# Patient Record
Sex: Female | Born: 2000 | Race: White | Hispanic: No | Marital: Single | Smoking: Never smoker
Health system: Southern US, Community
[De-identification: ages and names within clinical notes are randomized; demographics above are authoritative.]

## PROBLEM LIST (undated history)

## (undated) DIAGNOSIS — J45909 Unspecified asthma, uncomplicated: Secondary | ICD-10-CM

## (undated) DIAGNOSIS — J309 Allergic rhinitis, unspecified: Secondary | ICD-10-CM

## (undated) HISTORY — DX: Unspecified asthma, uncomplicated: J45.909

## (undated) HISTORY — DX: Allergic rhinitis, unspecified: J30.9

---
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
## 2018-02-12 ENCOUNTER — Ambulatory Visit: Payer: Medicaid Other | Admitting: Family Medicine

## 2018-02-14 ENCOUNTER — Ambulatory Visit (INDEPENDENT_AMBULATORY_CARE_PROVIDER_SITE_OTHER): Payer: Self-pay | Admitting: Family Medicine

## 2018-02-14 ENCOUNTER — Encounter: Payer: Self-pay | Admitting: Family Medicine

## 2018-02-14 ENCOUNTER — Ambulatory Visit
Admission: RE | Admit: 2018-02-14 | Discharge: 2018-02-14 | Disposition: A | Discharge status: Home / Self Care | Payer: Self-pay | Source: Ambulatory Visit | Attending: Family Medicine | Admitting: Family Medicine

## 2018-02-14 VITALS — BP 108/64 | HR 76 | Temp 98.5°F | Resp 16 | Wt 139.0 lb

## 2018-02-14 DIAGNOSIS — M419 Scoliosis, unspecified: Secondary | ICD-10-CM

## 2018-02-14 DIAGNOSIS — F419 Anxiety disorder, unspecified: Secondary | ICD-10-CM

## 2018-02-14 DIAGNOSIS — M4185 Other forms of scoliosis, thoracolumbar region: Secondary | ICD-10-CM | POA: Insufficient documentation

## 2018-02-14 MED ORDER — FLUOXETINE HCL 20 MG PO TABS: 30.0000 mg | ORAL_TABLET | Freq: Every day | ORAL | 3 refills | Status: DC

## 2018-02-14 NOTE — Assessment & Plan Note (Signed)
Likely MSK back pain from scoliosis Back exercises given

## 2018-02-14 NOTE — Patient Instructions (Signed)

## 2018-02-14 NOTE — Assessment & Plan Note (Signed)
Fairly well controlled and much improved Will increase Prozac to 30g daily to see if more improvement Continue propranolol prn performance anxiety, but patient is not currently needing this F/u in 6 months at physical

## 2018-02-14 NOTE — Progress Notes (Signed)
Patient: Kelli James Female    DOB: 02-04-2001   17 y.o.   MRN: [id number] Visit Date: 02/14/2018  Today's Provider: Shirlee LatchAngela Judithe Keetch, MD   I, Joslyn HyEmily Ratchford, CMA, am acting as scribe for Shirlee LatchAngela Sylvester Salonga, MD.  Chief Complaint  Patient presents with  . Anxiety   Subjective:    Anxiety  Presents for follow-up (LOV 11/14/2017. Pt was advised to continue Prozac and Propranolol for performance anxiety. Plan to increase dose if PHQ-9 or GAD-7 scores are high today.) visit. Patient reports no chest pain, compulsions, confusion, decreased concentration, depressed mood, dizziness, dry mouth, excessive worry, feeling of choking, hyperventilation, impotence, insomnia, irritability, malaise, muscle tension, nausea, nervous/anxious behavior, obsessions, palpitations, panic, restlessness, shortness of breath or suicidal ideas. The severity of symptoms is mild. The patient sleeps 8 hours per night. The quality of sleep is good.   Compliance with medications: good compliance. Treatment side effects: no side effects.  Feels like she is greatly improved but wonders if slightly higher medication dose would improve more.  H/o scoliosis with back pain worsening recently.  Worse with Standing for long periods of time. Better with sleeping. Described as Intermittent soreness in Thoracic spine.    No Known Allergies   Current Outpatient Medications:  .  Albuterol Sulfate (PROAIR RESPICLICK) 108 (90 BASE) MCG/ACT AEPB, Inhale 2 puffs into the lungs every 6 (six) hours as needed., Disp: , Rfl:  .  cetirizine (ZYRTEC) 10 MG tablet, Take 10 mg by mouth daily., Disp: , Rfl:  .  FLUoxetine (PROZAC) 20 MG tablet, Take 1 tablet (20 mg total) by mouth daily., Disp: 90 tablet, Rfl: 3 .  Norgestim-Eth Estrad Triphasic (ORTHO TRI-CYCLEN, 28, PO), Take 1 tablet by mouth daily., Disp: , Rfl:  .  propranolol (INDERAL) 40 MG tablet, Take 1 tablet (40 mg total) 3 (three) times daily as needed by mouth  (anxiety). Take 60 minutes before anxiety-producing event, Disp: 30 tablet, Rfl: 3  Review of Systems  Constitutional: Negative for irritability.  Respiratory: Negative for shortness of breath.   Cardiovascular: Negative for chest pain and palpitations.  Gastrointestinal: Negative for nausea.  Genitourinary: Negative for impotence.  Neurological: Negative for dizziness.  Psychiatric/Behavioral: Negative for confusion, decreased concentration and suicidal ideas. The patient is not nervous/anxious and does not have insomnia.     Social History   Tobacco Use  . Smoking status: Never Smoker  . Smokeless tobacco: Never Used  Substance Use Topics  . Alcohol use: No    Frequency: Never   Objective:   BP 108/64 (BP Location: Left Arm, Patient Position: Sitting, Cuff Size: Normal)   Pulse 76   Temp 98.5 F (36.9 C) (Oral)   Resp 16   Wt 139 lb (63 kg)   LMP 01/24/2018   SpO2 99%   BMI 22.44 kg/m  Vitals:   02/14/18 1048  BP: 108/64  Pulse: 76  Resp: 16  Temp: 98.5 F (36.9 C)  TempSrc: Oral  SpO2: 99%  Weight: 139 lb (63 kg)     Physical Exam  Constitutional: She is oriented to person, place, and time. She appears well-developed and well-nourished. No distress.  HENT:  Head: Normocephalic and atraumatic.  Eyes: Conjunctivae are normal. No scleral icterus.  Cardiovascular: Normal rate, regular rhythm, normal heart sounds and intact distal pulses.  No murmur heard. Pulmonary/Chest: Effort normal and breath sounds normal. No respiratory distress. She has no wheezes. She has no rales.  Musculoskeletal: She exhibits no edema.  Back: Mild TTP over thoracic spine musculature. No midline TTP. Normal ROM. Slight curvature noted. R shoulder higher than L when bending over.  Neurological: She is alert and oriented to person, place, and time.  Skin: Skin is warm and dry. No rash noted.  Psychiatric: She has a normal mood and affect. Her behavior is normal.  Vitals  reviewed.       Assessment & Plan:      Problem List Items Addressed This Visit      Musculoskeletal and Integument   Scoliosis    Likely MSK back pain from scoliosis Back exercises given       Relevant Orders   DG SCOLIOSIS EVAL COMPLETE SPINE 1 VIEW (Completed)     Other   Anxiety - Primary    Fairly well controlled and much improved Will increase Prozac to 30g daily to see if more improvement Continue propranolol prn performance anxiety, but patient is not currently needing this F/u in 6 months at physical      Relevant Medications   FLUoxetine (PROZAC) 20 MG tablet       Return in about 6 months (around 08/17/2018) for physical.   The entirety of the information documented in the History of Present Illness, Review of Systems and Physical Exam were personally obtained by me. Portions of this information were initially documented by Irving Burton Ratchford, CMA and reviewed by me for thoroughness and accuracy.    Erasmo Downer, MD, MPH [facility] 02/17/2018 8:11 AM

## 2018-02-17 ENCOUNTER — Telehealth: Payer: Self-pay

## 2018-02-17 NOTE — Telephone Encounter (Signed)
Pt advised.   Thanks,   -Lattie Riege  

## 2018-02-17 NOTE — Telephone Encounter (Signed)
-----   Message from Erasmo DownerAngela M Bacigalupo, MD sent at 02/17/2018  8:38 AM EDT ----- Mild scoliosis curvature of mid and lower back.  Strengthening and stretching as discussed  Bacigalupo, Marzella SchleinAngela M, MD, MPH [facility] 02/17/2018 8:38 AM

## 2018-03-11 ENCOUNTER — Encounter: Payer: Self-pay | Admitting: Family Medicine

## 2018-03-11 ENCOUNTER — Ambulatory Visit: Payer: BLUE CROSS/BLUE SHIELD | Admitting: Family Medicine

## 2018-03-11 VITALS — BP 108/72 | HR 79 | Temp 98.4°F | Resp 16 | Wt 140.0 lb

## 2018-03-11 DIAGNOSIS — J0140 Acute pansinusitis, unspecified: Secondary | ICD-10-CM | POA: Diagnosis not present

## 2018-03-11 MED ORDER — FLUTICASONE PROPIONATE 50 MCG/ACT NA SUSP: 2.0000 | Freq: Every day | NASAL | 6 refills | Status: DC

## 2018-03-11 MED ORDER — AMOXICILLIN-POT CLAVULANATE 875-125 MG PO TABS: 1.0000 | ORAL_TABLET | Freq: Two times a day (BID) | ORAL | 0 refills | Status: AC

## 2018-03-11 NOTE — Progress Notes (Signed)
Patient: Kelli James Female    DOB: 13-Apr-2001   17 y.o.   MRN: [id number] Visit Date: 03/11/2018  Today's Provider: Shirlee Latch, MD   I, Joslyn Hy, CMA, am acting as scribe for Shirlee Latch, MD.  Chief Complaint  Patient presents with  . Sinus Problem   Subjective:    Sinus Problem  This is a new problem. Episode onset: x 5 days. The problem has been gradually worsening since onset. Maximum temperature: highest documented temperature was 99. Pain severity now: moderate to severe. Associated symptoms include chills (Saturday), congestion, coughing (some), diaphoresis, ear pain, headaches (pulsating), a hoarse voice, sinus pressure, sneezing and a sore throat. Pertinent negatives include no neck pain, shortness of breath or swollen glands. (Teeth pain is present, right eye pain and discharge) Past treatments include acetaminophen (BC Powder, DayQuil, Netti Pot, and Zyrtec). The treatment provided no relief.  Pt went to [facility], which is riding grounds for ATVs, dirt bikes & other recreational vehicles over the weekend. She believes the pollen and dirt is causing her current symptoms.  She also has known sick contacts.  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  Their mother is also sick with a sinus infection and an ear infection.  No Known Allergies   Current Outpatient Medications:  .  cetirizine (ZYRTEC) 10 MG tablet, Take 10 mg by mouth daily., Disp: , Rfl:  .  FLUoxetine (PROZAC) 20 MG tablet, Take 1.5 tablets (30 mg total) by mouth daily., Disp: 135 tablet, Rfl: 3 .  Norgestim-Eth Estrad Triphasic (ORTHO TRI-CYCLEN, 28, PO), Take 1 tablet by mouth daily., Disp: , Rfl:  .  propranolol (INDERAL) 40 MG tablet, Take 1 tablet (40 mg total) 3 (three) times daily as needed by mouth (anxiety). Take 60 minutes before anxiety-producing event, Disp: 30 tablet, Rfl: 3 .  Albuterol Sulfate (PROAIR  RESPICLICK) 108 (90 BASE) MCG/ACT AEPB, Inhale 2 puffs into the lungs every 6 (six) hours as needed., Disp: , Rfl:   Review of Systems  Constitutional: Positive for chills (Saturday) and diaphoresis.  HENT: Positive for congestion, ear pain, hoarse voice, sinus pressure, sneezing and sore throat.   Respiratory: Positive for cough (some). Negative for shortness of breath.   Musculoskeletal: Negative for neck pain.  Neurological: Positive for headaches (pulsating).    Social History   Tobacco Use  . Smoking status: Never Smoker  . Smokeless tobacco: Never Used  Substance Use Topics  . Alcohol use: No    Frequency: Never   Objective:   BP 108/72 (BP Location: Left Arm, Patient Position: Sitting, Cuff Size: Normal)   Pulse 79   Temp 98.4 F (36.9 C) (Oral)   Resp 16   Wt 140 lb (63.5 kg)   LMP 02/11/2018   SpO2 98%   BMI 22.60 kg/m  Vitals:   03/11/18 0948  BP: 108/72  Pulse: 79  Resp: 16  Temp: 98.4 F (36.9 C)  TempSrc: Oral  SpO2: 98%  Weight: 140 lb (63.5 kg)     Physical Exam  Constitutional: She is oriented to person, place, and time. She appears well-developed and well-nourished. No distress.  HENT:  Head: Normocephalic and atraumatic.  Right Ear: Tympanic membrane, external ear and ear canal normal.  Left Ear: Tympanic membrane, external ear and ear canal normal.  Nose: Mucosal edema present. Right sinus exhibits maxillary sinus tenderness and frontal sinus tenderness. Left sinus exhibits maxillary  sinus tenderness and frontal sinus tenderness.  Mouth/Throat: Uvula is midline, oropharynx is clear and moist and mucous membranes are normal.  Eyes: Pupils are equal, round, and reactive to light. Conjunctivae are normal. Right eye exhibits no discharge. Left eye exhibits no discharge. No scleral icterus.  Neck: Neck supple. No thyromegaly present.  Cardiovascular: Normal rate, regular rhythm and normal heart sounds.  No murmur heard. Pulmonary/Chest: Effort  normal and breath sounds normal. No respiratory distress. She has no wheezes. She has no rales.  Musculoskeletal: She exhibits no edema.  Lymphadenopathy:    She has no cervical adenopathy.  Neurological: She is alert and oriented to person, place, and time.  Skin: Skin is warm and dry. No rash noted.  Psychiatric: She has a normal mood and affect. Her behavior is normal.  Vitals reviewed.     Assessment & Plan:   1. Acute non-recurrent pansinusitis -Symptoms and exam consistent with sinusitis -Likely caused by some combination of viral URI and dust and debris exposure -As she is only 5 days into illness, recommend not treating with antibiotic for at least 2-5 more days -Discussed natural course, symptomatic management with Sudafed or decongestant, Flonase, nasal rinses, antihistamine - Given prescription for Augmentin for 7 days if this does persist past 7-10 days    Meds ordered this encounter  Medications  . fluticasone (FLONASE) 50 MCG/ACT nasal spray    Sig: Place 2 sprays into both nostrils daily.    Dispense:  16 g    Refill:  6  . amoxicillin-clavulanate (AUGMENTIN) 875-125 MG tablet    Sig: Take 1 tablet by mouth 2 (two) times daily for 7 days.    Dispense:  14 tablet    Refill:  0     Return if symptoms worsen or fail to improve.   The entirety of the information documented in the History of Present Illness, Review of Systems and Physical Exam were personally obtained by me. Portions of this information were initially documented by Irving BurtonEmily Ratchford, CMA and reviewed by me for thoroughness and accuracy.    Erasmo DownerBacigalupo, Angela M, MD, MPH [facility] 03/11/2018 10:19 AM

## 2018-03-11 NOTE — Patient Instructions (Signed)

## 2018-03-19 DIAGNOSIS — N9089 Other specified noninflammatory disorders of vulva and perineum: Secondary | ICD-10-CM | POA: Diagnosis not present

## 2018-03-19 DIAGNOSIS — L292 Pruritus vulvae: Secondary | ICD-10-CM | POA: Diagnosis not present

## 2018-03-26 ENCOUNTER — Ambulatory Visit: Payer: BLUE CROSS/BLUE SHIELD | Admitting: Family Medicine

## 2018-03-26 ENCOUNTER — Encounter: Payer: Self-pay | Admitting: Family Medicine

## 2018-03-26 VITALS — BP 108/66 | HR 67 | Temp 97.9°F | Resp 16 | Wt 138.0 lb

## 2018-03-26 DIAGNOSIS — L249 Irritant contact dermatitis, unspecified cause: Secondary | ICD-10-CM

## 2018-03-26 MED ORDER — TRIAMCINOLONE ACETONIDE 0.5 % EX OINT: 1.0000 "application " | TOPICAL_OINTMENT | Freq: Two times a day (BID) | CUTANEOUS | 1 refills | Status: DC

## 2018-03-26 NOTE — Patient Instructions (Signed)

## 2018-03-26 NOTE — Progress Notes (Signed)
Patient: Kelli James Female    DOB: 11/22/2001   17 y.o.   MRN: [id number] Visit Date: 03/26/2018  Today's Provider: Shirlee Latch, MD   Chief Complaint  Patient presents with  . Rash   Subjective:    Rash  This is a new problem. Episode onset: x 4 days. The problem has been gradually worsening since onset. The affected locations include the back and left hip. The rash is characterized by redness and itchiness (raised above the skin and irritated when scratching it.). She was exposed to nothing. Pertinent negatives include no anorexia, congestion, cough, diarrhea, eye pain, facial edema, fatigue, fever, joint pain, nail changes, rhinorrhea, shortness of breath, sore throat or vomiting. Past treatments include topical steroids. The treatment provided no relief. There is no history of eczema or varicella.       No Known Allergies   Current Outpatient Medications:  .  Albuterol Sulfate (PROAIR RESPICLICK) 108 (90 BASE) MCG/ACT AEPB, Inhale 2 puffs into the lungs every 6 (six) hours as needed., Disp: , Rfl:  .  cetirizine (ZYRTEC) 10 MG tablet, Take 10 mg by mouth daily., Disp: , Rfl:  .  FLUoxetine (PROZAC) 20 MG tablet, Take 1.5 tablets (30 mg total) by mouth daily., Disp: 135 tablet, Rfl: 3 .  Norgestim-Eth Estrad Triphasic (ORTHO TRI-CYCLEN, 28, PO), Take 1 tablet by mouth daily., Disp: , Rfl:  .  propranolol (INDERAL) 40 MG tablet, Take 1 tablet (40 mg total) 3 (three) times daily as needed by mouth (anxiety). Take 60 minutes before anxiety-producing event, Disp: 30 tablet, Rfl: 3  Review of Systems  Constitutional: Negative for fatigue and fever.  HENT: Negative for congestion, rhinorrhea and sore throat.   Eyes: Negative for pain.  Respiratory: Negative for cough and shortness of breath.   Gastrointestinal: Negative for anorexia, diarrhea and vomiting.  Musculoskeletal: Negative for joint pain.  Skin: Positive for rash. Negative for nail changes.    Social  History   Tobacco Use  . Smoking status: Never Smoker  . Smokeless tobacco: Never Used  Substance Use Topics  . Alcohol use: No    Frequency: Never   Objective:   BP 108/66 (BP Location: Left Arm, Patient Position: Sitting, Cuff Size: Normal)   Pulse 67   Temp 97.9 F (36.6 C) (Oral)   Resp 16   Wt 138 lb (62.6 kg)   LMP 03/19/2018   SpO2 99%   BMI 22.27 kg/m  Vitals:   03/26/18 1341  BP: 108/66  Pulse: 67  Resp: 16  Temp: 97.9 F (36.6 C)  TempSrc: Oral  SpO2: 99%  Weight: 138 lb (62.6 kg)     Physical Exam  Constitutional: She is oriented to person, place, and time. She appears well-developed and well-nourished. No distress.  HENT:  Head: Normocephalic and atraumatic.  Eyes: Conjunctivae are normal. No scleral icterus.  Cardiovascular: Normal rate and regular rhythm.  Pulmonary/Chest: Effort normal. No respiratory distress.  Neurological: She is alert and oriented to person, place, and time.  Skin: Skin is warm and dry. Capillary refill takes less than 2 seconds. Rash noted. Rash is papular.  Erythematous papules in patterned design around bra lines.  Psychiatric: She has a normal mood and affect. Her behavior is normal.  Vitals reviewed.      Assessment & Plan:   1. Irritant contact dermatitis, unspecified trigger - suspect contact dermatitis from unclear exposure - though it is around bra lines so could be related to  clothing - treat with topical triamcinolone BID until resolution - reassured patient that it does not look like scabies or bed bug bites - return precautions discussed   Meds ordered this encounter  Medications  . triamcinolone ointment (KENALOG) 0.5 %    Sig: Apply 1 application topically 2 (two) times daily.    Dispense:  60 g    Refill:  1     Return if symptoms worsen or fail to improve.   The entirety of the information documented in the History of Present Illness, Review of Systems and Physical Exam were personally obtained  by me. Portions of this information were initially documented by Irving BurtonEmily Ratchford, CMA and reviewed by me for thoroughness and accuracy.    Erasmo DownerBacigalupo, Angela M, MD, MPH [facility] 03/26/2018 2:20 PM

## 2018-04-10 DIAGNOSIS — Z3041 Encounter for surveillance of contraceptive pills: Secondary | ICD-10-CM | POA: Diagnosis not present

## 2018-04-10 DIAGNOSIS — F330 Major depressive disorder, recurrent, mild: Secondary | ICD-10-CM | POA: Insufficient documentation

## 2018-04-10 DIAGNOSIS — F329 Major depressive disorder, single episode, unspecified: Secondary | ICD-10-CM | POA: Insufficient documentation

## 2018-04-10 DIAGNOSIS — Z6822 Body mass index (BMI) 22.0-22.9, adult: Secondary | ICD-10-CM | POA: Diagnosis not present

## 2018-04-10 DIAGNOSIS — Z130 Encounter for screening for diseases of the blood and blood-forming organs and certain disorders involving the immune mechanism: Secondary | ICD-10-CM | POA: Diagnosis not present

## 2018-04-10 DIAGNOSIS — Z01419 Encounter for gynecological examination (general) (routine) without abnormal findings: Secondary | ICD-10-CM | POA: Diagnosis not present

## 2018-04-10 DIAGNOSIS — F32A Depression, unspecified: Secondary | ICD-10-CM | POA: Insufficient documentation

## 2018-04-10 DIAGNOSIS — Z1389 Encounter for screening for other disorder: Secondary | ICD-10-CM | POA: Diagnosis not present

## 2018-04-10 DIAGNOSIS — Z113 Encounter for screening for infections with a predominantly sexual mode of transmission: Secondary | ICD-10-CM | POA: Diagnosis not present

## 2018-08-18 ENCOUNTER — Ambulatory Visit (INDEPENDENT_AMBULATORY_CARE_PROVIDER_SITE_OTHER): Payer: BLUE CROSS/BLUE SHIELD | Admitting: Family Medicine

## 2018-08-18 ENCOUNTER — Encounter: Payer: Self-pay | Admitting: Family Medicine

## 2018-08-18 VITALS — BP 110/70 | HR 65 | Temp 98.3°F | Ht 66.0 in | Wt 144.8 lb

## 2018-08-18 DIAGNOSIS — Z113 Encounter for screening for infections with a predominantly sexual mode of transmission: Secondary | ICD-10-CM

## 2018-08-18 DIAGNOSIS — F419 Anxiety disorder, unspecified: Secondary | ICD-10-CM | POA: Diagnosis not present

## 2018-08-18 DIAGNOSIS — Z23 Encounter for immunization: Secondary | ICD-10-CM

## 2018-08-18 DIAGNOSIS — Z0000 Encounter for general adult medical examination without abnormal findings: Secondary | ICD-10-CM | POA: Diagnosis not present

## 2018-08-18 DIAGNOSIS — F329 Major depressive disorder, single episode, unspecified: Secondary | ICD-10-CM

## 2018-08-18 DIAGNOSIS — F32A Depression, unspecified: Secondary | ICD-10-CM

## 2018-08-18 DIAGNOSIS — Z114 Encounter for screening for human immunodeficiency virus [HIV]: Secondary | ICD-10-CM

## 2018-08-18 MED ORDER — HYDROXYZINE HCL 25 MG PO TABS: 25.0000 mg | ORAL_TABLET | Freq: Three times a day (TID) | ORAL | 2 refills | Status: DC | PRN

## 2018-08-18 NOTE — Assessment & Plan Note (Signed)
Fairly well controlled and much improved Continue Prozac 30 mg daily Propranolol is not helping with social anxiety, we will try hydroxyzine instead for situational anxiety Follow-up in 1 year or sooner as needed

## 2018-08-18 NOTE — Assessment & Plan Note (Signed)
Well-controlled Continue Prozac 30 mg daily Follow-up in 1 year or sooner as needed

## 2018-08-18 NOTE — Assessment & Plan Note (Signed)
>>  ASSESSMENT AND PLAN FOR DEPRESSIVE DISORDER WRITTEN ON 08/18/2018  4:36 PM BY Erasmo Downer, MD  Well-controlled Continue Prozac 30 mg daily Follow-up in 1 year or sooner as needed

## 2018-08-18 NOTE — Progress Notes (Signed)
Patient: Kelli James, Female    DOB: 2001-04-28, 17 y.o.   MRN: [id number] Visit Date: 08/18/2018  Today's Provider: Lavon Paganini, MD   Chief Complaint  Patient presents with  . Annual Exam   Subjective:  I, Tiburcio Pea, CMA, am acting as a scribe for Lavon Paganini, MD.    Annual physical exam Kelli James is a 17 y.o. female who presents today for health maintenance and complete physical. She feels well. She reports exercising 3 days per week. She reports she is sleeping well. -----------------------------------------------------------------   Review of Systems  Constitutional: Negative.   HENT: Negative.   Eyes: Negative.   Respiratory: Negative.   Cardiovascular: Negative.   Gastrointestinal: Negative.   Endocrine: Negative.   Genitourinary: Negative.   Musculoskeletal: Negative.   Skin: Negative.   Allergic/Immunologic: Positive for environmental allergies.  Neurological: Negative.   Hematological: Negative.   Psychiatric/Behavioral: The patient is nervous/anxious.     Social History      She  reports that she has never smoked. She has never used smokeless tobacco. She reports that she does not drink alcohol or use drugs.       Social History   Socioeconomic History  . Marital status: Single    Spouse name: Not on file  . Number of children: 0  . Years of education: Not on file  . Highest education level: High school graduate  Occupational History  . Occupation: nanny    Comment: planning on starting college  Social Needs  . Financial resource strain: Not on file  . Food insecurity:    Worry: Not on file    Inability: Not on file  . Transportation needs:    Medical: Not on file    Non-medical: Not on file  Tobacco Use  . Smoking status: Never Smoker  . Smokeless tobacco: Never Used  Substance and Sexual Activity  . Alcohol use: No    Frequency: Never  . Drug use: No  . Sexual activity: Yes    Partners: Male    Birth  control/protection: OCP  Lifestyle  . Physical activity:    Days per week: Not on file    Minutes per session: Not on file  . Stress: Not on file  Relationships  . Social connections:    Talks on phone: Not on file    Gets together: Not on file    Attends religious service: Not on file    Active member of club or organization: Not on file    Attends meetings of clubs or organizations: Not on file    Relationship status: Not on file  Other Topics Concern  . Not on file  Social History Narrative  . Not on file    Past Medical History:  Diagnosis Date  . Allergic rhinitis   . Asthma      Patient Active Problem List   Diagnosis Date Noted  . Depressive disorder 04/10/2018  . Scoliosis 02/14/2018  . Anxiety 10/17/2017  . Asthma 08/20/2015  . Allergic rhinitis 08/20/2015  . Erythema 01/04/2015  . Xerosis cutis 01/04/2015  . Acne 03/24/2014    Past Surgical History:  Procedure Laterality Date  . WISDOM TOOTH EXTRACTION  2016    Family History        Family Status  Relation Name Status  . Mother  Deceased       suicide  . Father  Alive  . Sister  Alive  . PGM  (  Not Specified)  . PGF  (Not Specified)  . Neg Hx  (Not Specified)        Her family history includes Arthritis in her paternal grandmother; Atrial fibrillation in her paternal grandfather; Bipolar disorder in her mother; Depression in her mother and paternal grandfather; Diverticulitis in her father; Healthy in her sister; Heart disease in her paternal grandfather; Hypercholesterolemia in her paternal grandfather; Hypertension in her father and paternal grandfather; Lupus in her paternal grandmother; Thyroid disease in her paternal grandmother. There is no history of Breast cancer or Colon cancer.      No Known Allergies   Current Outpatient Medications:  .  cetirizine (ZYRTEC) 10 MG tablet, Take 10 mg by mouth daily., Disp: , Rfl:  .  FLUoxetine (PROZAC) 20 MG tablet, Take 1.5 tablets (30 mg total) by  mouth daily., Disp: 135 tablet, Rfl: 3 .  Norgestimate-Ethinyl Estradiol Triphasic (TRI-SPRINTEC) 0.18/0.215/0.25 MG-35 MCG tablet, Tri-Sprintec (28) 0.18 mg(7)/0.215 mg(7)/0.25 mg(7)-35 mcg tablet, Disp: , Rfl:  .  propranolol (INDERAL) 40 MG tablet, Take 1 tablet (40 mg total) 3 (three) times daily as needed by mouth (anxiety). Take 60 minutes before anxiety-producing event, Disp: 30 tablet, Rfl: 3 .  triamcinolone ointment (KENALOG) 0.5 %, Apply 1 application topically 2 (two) times daily., Disp: 60 g, Rfl: 1   Patient Care Team: Virginia Crews, MD as PCP - General (Family Medicine)      Objective:   Vitals: BP 110/70 (BP Location: Right Arm, Patient Position: Sitting, Cuff Size: Normal)   Pulse 65   Temp 98.3 F (36.8 C) (Oral)   Ht '5\' 6"'  (1.676 m)   Wt 144 lb 12.8 oz (65.7 kg)   SpO2 99%   BMI 23.37 kg/m    Vitals:   08/18/18 1514  BP: 110/70  Pulse: 65  Temp: 98.3 F (36.8 C)  TempSrc: Oral  SpO2: 99%  Weight: 144 lb 12.8 oz (65.7 kg)  Height: '5\' 6"'  (1.676 m)     Physical Exam  Constitutional: She is oriented to person, place, and time. She appears well-developed and well-nourished. No distress.  HENT:  Head: Normocephalic and atraumatic.  Right Ear: External ear normal.  Left Ear: External ear normal.  Nose: Nose normal.  Mouth/Throat: Oropharynx is clear and moist.  Eyes: Pupils are equal, round, and reactive to light. Conjunctivae and EOM are normal. No scleral icterus.  Neck: Neck supple. No thyromegaly present.  Cardiovascular: Normal rate, regular rhythm, normal heart sounds and intact distal pulses.  No murmur heard. Pulmonary/Chest: Effort normal and breath sounds normal. No respiratory distress. She has no wheezes. She has no rales.  Abdominal: Soft. Bowel sounds are normal. She exhibits no distension. There is no tenderness. There is no rebound and no guarding.  Musculoskeletal: She exhibits no edema or deformity.  Lymphadenopathy:    She has no  cervical adenopathy.  Neurological: She is alert and oriented to person, place, and time.  Skin: Skin is warm and dry. Capillary refill takes less than 2 seconds. No rash noted.  Psychiatric: She has a normal mood and affect. Her behavior is normal.  Vitals reviewed.    Depression Screen PHQ 2/9 Scores 08/18/2018 02/14/2018 10/17/2017 10/17/2017  PHQ - 2 Score 0 0 0 0  PHQ- 9 Score 0 1 5 -    GAD 7 : Generalized Anxiety Score 08/18/2018 02/14/2018 10/17/2017  Nervous, Anxious, on Edge '1 1 2  ' Control/stop worrying '1 1 2  ' Worry too much - different things 0 0  2  Trouble relaxing 0 0 1  Restless 0 0 0  Easily annoyed or irritable '2 1 3  ' Afraid - awful might happen 0 1 1  Total GAD 7 Score '4 4 11  ' Anxiety Difficulty Somewhat difficult Not difficult at all Very difficult      Assessment & Plan:     Routine Health Maintenance and Physical Exam  Exercise Activities and Dietary recommendations Goals   None     Immunization History  Administered Date(s) Administered  . DTaP 03/29/1999, 06/08/1999, 07/26/1999, 05/09/2000, 04/07/2004  . HPV 9-valent 01/04/2016, 03/21/2016, 07/18/2016  . Hepatitis A 05/23/2010, 08/21/2011  . Hepatitis B 03/29/1999, 06/08/1999, 07/26/1999  . HiB (PRP-OMP) 03/29/1999, 06/08/1999, 07/26/1999, 01/29/2000  . IPV 03/29/1999, 06/03/1999, 01/29/2000, 04/07/2004  . Influenza-Unspecified 08/18/2017  . MMR 01/28/2010, 04/07/2010  . Meningococcal Conjugate 05/23/2010, 04/26/2014  . Pneumococcal-Unspecified 01/29/2000, 05/09/2000  . Tdap 05/23/2010  . Varicella 09/16/2001, 11/08/2006    Health Maintenance  Topic Date Due  . CHLAMYDIA SCREENING  01/23/2014  . HIV Screening  01/23/2014  . INFLUENZA VACCINE  07/10/2018  . TETANUS/TDAP  05/23/2020     Discussed health benefits of physical activity, and encouraged her to engage in regular exercise appropriate for her age and condition.     --------------------------------------------------------------------  Problem List Items Addressed This Visit      Other   Anxiety    Fairly well controlled and much improved Continue Prozac 30 mg daily Propranolol is not helping with social anxiety, we will try hydroxyzine instead for situational anxiety Follow-up in 1 year or sooner as needed      Relevant Medications   hydrOXYzine (ATARAX/VISTARIL) 25 MG tablet   Depressive disorder    Well-controlled Continue Prozac 30 mg daily Follow-up in 1 year or sooner as needed      Relevant Medications   hydrOXYzine (ATARAX/VISTARIL) 25 MG tablet    Other Visit Diagnoses    Encounter for annual physical exam    -  Primary   Relevant Orders   CBC w/Diff/Platelet   Comprehensive metabolic panel   Screening for HIV (human immunodeficiency virus)       Relevant Orders   HIV antibody (with reflex)   Screen for STD (sexually transmitted disease)       Relevant Orders   GC/Chlamydia Probe Amp([facility])   Flu vaccine need       Relevant Orders   Flu Vaccine QUAD 6+ mos PF IM (Fluarix Quad PF) (Completed)       Return in about 1 year (around 08/19/2019) for physical.   The entirety of the information documented in the History of Present Illness, Review of Systems and Physical Exam were personally obtained by me. Portions of this information were initially documented by Tiburcio Pea, CMA and reviewed by me for thoroughness and accuracy.    Virginia Crews, MD, MPH [facility] 08/18/2018 4:37 PM

## 2018-08-18 NOTE — Patient Instructions (Signed)
Preventive Care 18-39 Years, Female Preventive care refers to lifestyle choices and visits with your health care provider that can promote health and wellness. What does preventive care include?  A yearly physical exam. This is also called an annual well check.  Dental exams once or twice a year.  Routine eye exams. Ask your health care provider how often you should have your eyes checked.  Personal lifestyle choices, including: ? Daily care of your teeth and gums. ? Regular physical activity. ? Eating a healthy diet. ? Avoiding tobacco and drug use. ? Limiting alcohol use. ? Practicing safe sex. ? Taking vitamin and mineral supplements as recommended by your health care provider. What happens during an annual well check? The services and screenings done by your health care provider during your annual well check will depend on your age, overall health, lifestyle risk factors, and family history of disease. Counseling Your health care provider may ask you questions about your:  Alcohol use.  Tobacco use.  Drug use.  Emotional well-being.  Home and relationship well-being.  Sexual activity.  Eating habits.  Work and work Statistician.  Method of birth control.  Menstrual cycle.  Pregnancy history.  Screening You may have the following tests or measurements:  Height, weight, and BMI.  Diabetes screening. This is done by checking your blood sugar (glucose) after you have not eaten for a while (fasting).  Blood pressure.  Lipid and cholesterol levels. These may be checked every 5 years starting at age 66.  Skin check.  Hepatitis C blood test.  Hepatitis B blood test.  Sexually transmitted disease (STD) testing.  BRCA-related cancer screening. This may be done if you have a family history of breast, ovarian, tubal, or peritoneal cancers.  Pelvic exam and Pap test. This may be done every 3 years starting at age 40. Starting at age 59, this may be done every 5  years if you have a Pap test in combination with an HPV test.  Discuss your test results, treatment options, and if necessary, the need for more tests with your health care provider. Vaccines Your health care provider may recommend certain vaccines, such as:  Influenza vaccine. This is recommended every year.  Tetanus, diphtheria, and acellular pertussis (Tdap, Td) vaccine. You may need a Td booster every 10 years.  Varicella vaccine. You may need this if you have not been vaccinated.  HPV vaccine. If you are 69 or younger, you may need three doses over 6 months.  Measles, mumps, and rubella (MMR) vaccine. You may need at least one dose of MMR. You may also need a second dose.  Pneumococcal 13-valent conjugate (PCV13) vaccine. You may need this if you have certain conditions and were not previously vaccinated.  Pneumococcal polysaccharide (PPSV23) vaccine. You may need one or two doses if you smoke cigarettes or if you have certain conditions.  Meningococcal vaccine. One dose is recommended if you are age 27-21 years and a first-year college student living in a residence hall, or if you have one of several medical conditions. You may also need additional booster doses.  Hepatitis A vaccine. You may need this if you have certain conditions or if you travel or work in places where you may be exposed to hepatitis A.  Hepatitis B vaccine. You may need this if you have certain conditions or if you travel or work in places where you may be exposed to hepatitis B.  Haemophilus influenzae type b (Hib) vaccine. You may need this if  you have certain risk factors.  Talk to your health care provider about which screenings and vaccines you need and how often you need them. This information is not intended to replace advice given to you by your health care provider. Make sure you discuss any questions you have with your health care provider. Document Released: 01/22/2002 Document Revised: 08/15/2016  Document Reviewed: 09/27/2015 Elsevier Interactive Patient Education  [employer].

## 2018-08-19 LAB — CBC WITH DIFFERENTIAL/PLATELET
BASOS ABS: 0 10*3/uL (ref 0.0–0.2)
Basos: 1 %
EOS (ABSOLUTE): 0.2 10*3/uL (ref 0.0–0.4)
EOS: 5 %
HEMATOCRIT: 37.5 % (ref 34.0–46.6)
HEMOGLOBIN: 12.7 g/dL (ref 11.1–15.9)
IMMATURE GRANS (ABS): 0 10*3/uL (ref 0.0–0.1)
IMMATURE GRANULOCYTES: 0 %
LYMPHS ABS: 2.2 10*3/uL (ref 0.7–3.1)
Lymphs: 41 %
MCH: 29.9 pg (ref 26.6–33.0)
MCHC: 33.9 g/dL (ref 31.5–35.7)
MCV: 88 fL (ref 79–97)
Monocytes Absolute: 0.6 10*3/uL (ref 0.1–0.9)
Monocytes: 11 %
Neutrophils Absolute: 2.3 10*3/uL (ref 1.4–7.0)
Neutrophils: 42 %
Platelets: 243 10*3/uL (ref 150–450)
RBC: 4.25 x10E6/uL (ref 3.77–5.28)
RDW: 11.5 % — ABNORMAL LOW (ref 12.3–15.4)
WBC: 5.3 10*3/uL (ref 3.4–10.8)

## 2018-08-19 LAB — HIV ANTIBODY (ROUTINE TESTING W REFLEX): HIV Screen 4th Generation wRfx: NONREACTIVE

## 2018-08-19 LAB — COMPREHENSIVE METABOLIC PANEL
ALBUMIN: 4.3 g/dL (ref 3.5–5.5)
ALT: 21 IU/L (ref 0–32)
AST: 22 IU/L (ref 0–40)
Albumin/Globulin Ratio: 1.5 (ref 1.2–2.2)
Alkaline Phosphatase: 51 IU/L (ref 39–117)
BUN/Creatinine Ratio: 16 (ref 9–23)
BUN: 10 mg/dL (ref 6–20)
Bilirubin Total: 0.3 mg/dL (ref 0.0–1.2)
CALCIUM: 9.6 mg/dL (ref 8.7–10.2)
CHLORIDE: 101 mmol/L (ref 96–106)
CO2: 22 mmol/L (ref 20–29)
CREATININE: 0.64 mg/dL (ref 0.57–1.00)
GFR calc Af Amer: 150 mL/min/{1.73_m2} (ref >59)
GFR calc non Af Amer: 130 mL/min/{1.73_m2} (ref >59)
GLOBULIN, TOTAL: 2.9 g/dL (ref 1.5–4.5)
GLUCOSE: 89 mg/dL (ref 65–99)
POTASSIUM: 3.9 mmol/L (ref 3.5–5.2)
Sodium: 136 mmol/L (ref 134–144)
TOTAL PROTEIN: 7.2 g/dL (ref 6.0–8.5)

## 2018-08-19 LAB — GC/CHLAMYDIA PROBE AMP
CHLAMYDIA, DNA PROBE: NEGATIVE
Neisseria gonorrhoeae by PCR: NEGATIVE

## 2018-08-22 ENCOUNTER — Encounter: Payer: Self-pay | Admitting: Family Medicine

## 2019-03-06 IMAGING — CR DG SCOLIOSIS EVAL COMPLETE SPINE 1V
1 series · 1 of 1 positions shown · non-contrast
Comparison: Chest radiographs 07/21/2007.

CLINICAL DATA: 17-year-old female with scoliosis and back pain.

EXAM:
DG SCOLIOSIS EVAL COMPLETE SPINE 1V

[dg scoliosis eval complete spine 1 view]
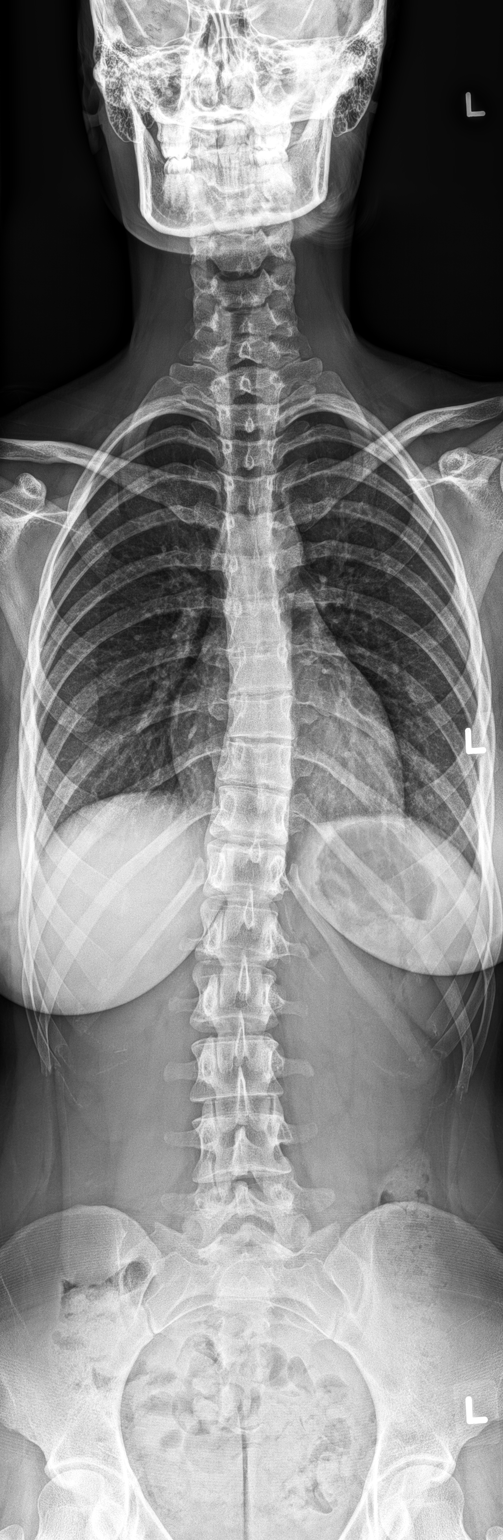

[1 of 1 positions shown; findings below may reference images not displayed]

FINDINGS: Bone mineralization is within normal limits. The patient appears
skeletally mature. Normal segmentation in the cervical, thoracic,
and lumbar spine.

There is 13 degrees of levoconvex lower thoracic scoliosis measured
from T6-T7 to T12-L1. Apex is at T9.

There is mild 10 or 11 degrees of dextroconvex lumbar curvature from
the L1 to the L5 level. Apex is at L2.

Normal chest and abdominal visceral contours. There is retained
stool throughout large bowel in the pelvis.
IMPRESSION: 1. Levoconvex lower thoracic scoliosis with apex at T9 measuring 13
degrees.
2. Mild 10 degrees of dextroconvex lumbar scoliosis

## 2019-08-10 DIAGNOSIS — Z3041 Encounter for surveillance of contraceptive pills: Secondary | ICD-10-CM | POA: Diagnosis not present

## 2019-08-10 DIAGNOSIS — F419 Anxiety disorder, unspecified: Secondary | ICD-10-CM | POA: Diagnosis not present

## 2019-08-10 DIAGNOSIS — Z87891 Personal history of nicotine dependence: Secondary | ICD-10-CM | POA: Diagnosis not present

## 2019-08-10 DIAGNOSIS — Z23 Encounter for immunization: Secondary | ICD-10-CM | POA: Diagnosis not present

## 2019-08-10 DIAGNOSIS — Z793 Long term (current) use of hormonal contraceptives: Secondary | ICD-10-CM | POA: Diagnosis not present

## 2019-08-10 DIAGNOSIS — Z0000 Encounter for general adult medical examination without abnormal findings: Secondary | ICD-10-CM | POA: Diagnosis not present

## 2019-08-10 DIAGNOSIS — F17200 Nicotine dependence, unspecified, uncomplicated: Secondary | ICD-10-CM | POA: Diagnosis not present

## 2019-08-21 ENCOUNTER — Encounter: Payer: BLUE CROSS/BLUE SHIELD | Admitting: Family Medicine

## 2019-09-14 DIAGNOSIS — F330 Major depressive disorder, recurrent, mild: Secondary | ICD-10-CM | POA: Insufficient documentation

## 2019-09-14 DIAGNOSIS — F411 Generalized anxiety disorder: Secondary | ICD-10-CM | POA: Diagnosis not present

## 2019-09-15 DIAGNOSIS — F330 Major depressive disorder, recurrent, mild: Secondary | ICD-10-CM | POA: Diagnosis not present

## 2019-09-15 DIAGNOSIS — F419 Anxiety disorder, unspecified: Secondary | ICD-10-CM | POA: Diagnosis not present

## 2019-09-22 DIAGNOSIS — F419 Anxiety disorder, unspecified: Secondary | ICD-10-CM | POA: Diagnosis not present

## 2019-09-22 DIAGNOSIS — F330 Major depressive disorder, recurrent, mild: Secondary | ICD-10-CM | POA: Diagnosis not present

## 2019-09-29 DIAGNOSIS — F330 Major depressive disorder, recurrent, mild: Secondary | ICD-10-CM | POA: Diagnosis not present

## 2019-09-29 DIAGNOSIS — F419 Anxiety disorder, unspecified: Secondary | ICD-10-CM | POA: Diagnosis not present

## 2019-10-06 DIAGNOSIS — F330 Major depressive disorder, recurrent, mild: Secondary | ICD-10-CM | POA: Diagnosis not present

## 2019-10-06 DIAGNOSIS — F419 Anxiety disorder, unspecified: Secondary | ICD-10-CM | POA: Diagnosis not present

## 2019-10-15 DIAGNOSIS — F330 Major depressive disorder, recurrent, mild: Secondary | ICD-10-CM | POA: Diagnosis not present

## 2019-10-15 DIAGNOSIS — F419 Anxiety disorder, unspecified: Secondary | ICD-10-CM | POA: Diagnosis not present

## 2019-10-19 DIAGNOSIS — F419 Anxiety disorder, unspecified: Secondary | ICD-10-CM | POA: Diagnosis not present

## 2019-10-19 DIAGNOSIS — F330 Major depressive disorder, recurrent, mild: Secondary | ICD-10-CM | POA: Diagnosis not present

## 2019-11-02 DIAGNOSIS — F330 Major depressive disorder, recurrent, mild: Secondary | ICD-10-CM | POA: Diagnosis not present

## 2019-11-02 DIAGNOSIS — F419 Anxiety disorder, unspecified: Secondary | ICD-10-CM | POA: Diagnosis not present

## 2019-11-12 DIAGNOSIS — F330 Major depressive disorder, recurrent, mild: Secondary | ICD-10-CM | POA: Diagnosis not present

## 2019-11-12 DIAGNOSIS — F418 Other specified anxiety disorders: Secondary | ICD-10-CM | POA: Diagnosis not present

## 2019-11-26 DIAGNOSIS — F330 Major depressive disorder, recurrent, mild: Secondary | ICD-10-CM | POA: Diagnosis not present

## 2019-11-26 DIAGNOSIS — F419 Anxiety disorder, unspecified: Secondary | ICD-10-CM | POA: Diagnosis not present

## 2021-12-13 ENCOUNTER — Encounter: Payer: Self-pay | Admitting: Physician Assistant

## 2021-12-13 ENCOUNTER — Other Ambulatory Visit: Payer: Self-pay

## 2021-12-13 ENCOUNTER — Ambulatory Visit (INDEPENDENT_AMBULATORY_CARE_PROVIDER_SITE_OTHER): Payer: 59 | Admitting: Physician Assistant

## 2021-12-13 VITALS — BP 116/77 | HR 74 | Temp 98.2°F | Ht 65.0 in | Wt 153.0 lb

## 2021-12-13 DIAGNOSIS — Z30011 Encounter for initial prescription of contraceptive pills: Secondary | ICD-10-CM

## 2021-12-13 DIAGNOSIS — F419 Anxiety disorder, unspecified: Secondary | ICD-10-CM | POA: Diagnosis not present

## 2021-12-13 DIAGNOSIS — Z7689 Persons encountering health services in other specified circumstances: Secondary | ICD-10-CM

## 2021-12-13 DIAGNOSIS — F330 Major depressive disorder, recurrent, mild: Secondary | ICD-10-CM | POA: Diagnosis not present

## 2021-12-13 LAB — POCT URINE PREGNANCY: Preg Test, Ur: NEGATIVE

## 2021-12-13 MED ORDER — NORGESTIM-ETH ESTRAD TRIPHASIC 0.18/0.215/0.25 MG-35 MCG PO TABS: ORAL_TABLET | ORAL | 6 refills | Status: DC

## 2021-12-13 NOTE — Patient Instructions (Signed)

## 2021-12-13 NOTE — Progress Notes (Signed)
New Patient Office Visit  Subjective:  Patient ID: Kelli James, female    DOB: 05/12/2001  Age: 21 y.o. MRN: [id number]  CC:  Chief Complaint  Patient presents with   New Patient (Initial Visit)    HPI [name] presents to establish care. Patient has a past medical history of anxiety and depression. Reports was on prozac which she stopped and feels much better. Takes lorazepam 0.5 mg when needed for severe anxiety which is once every 2-3 months. Patient has tried propranolol and hydroxyzine in the past which were ineffective. Patient reports was on birth control for contraception and ran out of medication in August so has been without medicine. Also reports did not have insurance at that time but now she does. Tolerated Tri-Sprintec without issues. Patient is engaged and reports no current plans of having a baby. Denies issues with periods or hx of migraine.    Past Medical History:  Diagnosis Date   Allergic rhinitis    Asthma     Past Surgical History:  Procedure Laterality Date   WISDOM TOOTH EXTRACTION  2016    Family History  Problem Relation Age of Onset   Bipolar disorder Mother    Depression Mother    Hypertension Father    Diverticulitis Father    Healthy Sister    Lupus Paternal Grandmother    Thyroid disease Paternal Grandmother    Arthritis Paternal Grandmother    Heart disease Paternal Grandfather        has pacemaker   Atrial fibrillation Paternal Grandfather    Hypercholesterolemia Paternal Grandfather    Hypertension Paternal Grandfather    Depression Paternal Grandfather    Breast cancer Neg Hx    Colon cancer Neg Hx     Social History   Socioeconomic History   Marital status: Single    Spouse name: Not on file   Number of children: 0   Years of education: Not on file   Highest education level: High school graduate  Occupational History   Occupation: nanny    Comment: planning on starting college  Tobacco Use   Smoking  status: Never   Smokeless tobacco: Never  Vaping Use   Vaping Use: Never used  Substance and Sexual Activity   Alcohol use: No   Drug use: No   Sexual activity: Yes    Partners: Male    Birth control/protection: OCP  Other Topics Concern   Not on file  Social History Narrative   Not on file   Social Determinants of Health   Financial Resource Strain: Not on file  Food Insecurity: Not on file  Transportation Needs: Not on file  Physical Activity: Not on file  Stress: Not on file  Social Connections: Not on file  Intimate Partner Violence: Not on file   Depression screen [facility] 2/9 12/13/2021 04/28/2019 08/18/2018 02/14/2018 10/17/2017  Decreased Interest 0 0 0 0 0  Down, Depressed, Hopeless 0 0 0 0 0  PHQ - 2 Score 0 0 0 0 0  Altered sleeping 0 - 0 0 0  Tired, decreased energy 0 - 0 1 1  Change in appetite 0 - 0 0 0  Feeling bad or failure about yourself  0 - 0 0 1  Trouble concentrating 0 - 0 0 3  Moving slowly or fidgety/restless 0 - 0 0 0  Suicidal thoughts 0 - 0 0 0  PHQ-9 Score 0 - 0 1 5  Difficult doing work/chores Not difficult at  all - Not difficult at all Not difficult at all Extremely dIfficult   GAD 7 : Generalized Anxiety Score 12/13/2021 08/18/2018 02/14/2018 10/17/2017  Nervous, Anxious, on Edge 1 1 1 2   Control/stop worrying 0 1 1 2   Worry too much - different things 0 0 0 2  Trouble relaxing 0 0 0 1  Restless 0 0 0 0  Easily annoyed or irritable 0 2 1 3   Afraid - awful might happen 0 0 1 1  Total GAD 7 Score 1 4 4 11   Anxiety Difficulty Not difficult at all Somewhat difficult Not difficult at all Very difficult     ROS Review of Systems Review of Systems:  A fourteen system review of systems was performed and found to be positive as per HPI.  Objective:   Today's Vitals: BP 116/77    Pulse 74    Temp 98.2 F (36.8 C)    Ht 5\' 5"  (1.651 m)    Wt 153 lb (69.4 kg)    SpO2 98%    BMI 25.46 kg/m   Physical Exam General:  Well Developed, well nourished,  appropriate for stated age.  Neuro:  Alert and oriented,  extra-ocular muscles intact  HEENT:  Normocephalic, atraumatic, neck supple Skin:  no gross rash, warm, pink. Cardiac:  RRR, S1 S2 Respiratory:  CTA B/L w/o wheezing, crackles or rales. Vascular:  Ext warm, no cyanosis apprec.; cap RF less 2 sec. Psych:  No HI/SI, judgement and insight good, Euthymic mood. Full Affect.  Assessment & Plan:   Problem List Items Addressed This Visit       Other   Anxiety   Mild episode of recurrent major depressive disorder ([place])   Other Visit Diagnoses     Encounter to establish care    -  Primary   Oral contraception initiation       Relevant Medications   Norgestimate-Ethinyl Estradiol Triphasic 0.18/0.215/0.25 MG-35 MCG tablet   Other Relevant Orders   POCT urine pregnancy (Completed)      Encounter to establish care, Oral contraception initiation: -Reviewed records in EHR. -UPT resulted negative. Provided refill of Tri-Sprintec. Discussed to continue to avoid tobacco use. -Discussed with patient guidelines for pap smear/screening for cervical cancer and patient will think about if wants referral to OB/GYN or will get pap done with her CPE in 3-4 months.   Anxiety: -Stable. Patient has failed hydroxyzine and propranolol. GAD-7 score of 1. -Discussed with patient controlled substance policy for benzodiazepines and is agreeable to 2 refills per year as needed for lorazepam 0.5 mg. Controlled substance contract obtained. -Will continue to monitor.  Mild episode of recurrent major depressive disorder: -Stable. PHQ-9 score of 0. -Patient managing without medication.  -Will continue to monitor.  Outpatient Encounter Medications as of 12/13/2021  Medication Sig   cetirizine (ZYRTEC) 10 MG tablet Take 10 mg by mouth daily.   [DISCONTINUED] hydrOXYzine (ATARAX/VISTARIL) 25 MG tablet Take 1 tablet (25 mg total) by mouth 3 (three) times daily as needed for anxiety.   [DISCONTINUED]  Norgestimate-Ethinyl Estradiol Triphasic 0.18/0.215/0.25 MG-35 MCG tablet Tri-Sprintec (28) 0.18 mg(7)/0.215 mg(7)/0.25 mg(7)-35 mcg tablet   Norgestimate-Ethinyl Estradiol Triphasic 0.18/0.215/0.25 MG-35 MCG tablet Tri-Sprintec (28) 0.18 mg(7)/0.215 mg(7)/0.25 mg(7)-35 mcg tablet   [DISCONTINUED] FLUoxetine (PROZAC) 20 MG tablet Take 1.5 tablets (30 mg total) by mouth daily.   [DISCONTINUED] triamcinolone ointment (KENALOG) 0.5 % Apply 1 application topically 2 (two) times daily.   No facility-administered encounter medications on file as of 12/13/2021.  Follow-up: Return in about 4 months (around 04/12/2022) for CPE.   Lorrene Reid, PA-C

## 2022-02-26 ENCOUNTER — Encounter: Payer: Self-pay | Admitting: Physician Assistant

## 2022-04-05 NOTE — Patient Instructions (Incomplete)
Preventive Care 21-23 Years Old, Female ?Preventive care refers to lifestyle choices and visits with your health care provider that can promote health and wellness. Preventive care visits are also called wellness exams. ?What can I expect for my preventive care visit? ?Counseling ?During your preventive care visit, your health care provider may ask about your: ?Medical history, including: ?Past medical problems. ?Family medical history. ?Pregnancy history. ?Current health, including: ?Menstrual cycle. ?Method of birth control. ?Emotional well-being. ?Home life and relationship well-being. ?Sexual activity and sexual health. ?Lifestyle, including: ?Alcohol, nicotine or tobacco, and drug use. ?Access to firearms. ?Diet, exercise, and sleep habits. ?Work and work environment. ?Sunscreen use. ?Safety issues such as seatbelt and bike helmet use. ?Physical exam ?Your health care provider may check your: ?Height and weight. These may be used to calculate your BMI (body mass index). BMI is a measurement that tells if you are at a healthy weight. ?Waist circumference. This measures the distance around your waistline. This measurement also tells if you are at a healthy weight and may help predict your risk of certain diseases, such as type 2 diabetes and high blood pressure. ?Heart rate and blood pressure. ?Body temperature. ?Skin for abnormal spots. ?What immunizations do I need? ? ?Vaccines are usually given at various ages, according to a schedule. Your health care provider will recommend vaccines for you based on your age, medical history, and lifestyle or other factors, such as travel or where you work. ?What tests do I need? ?Screening ?Your health care provider may recommend screening tests for certain conditions. This may include: ?Pelvic exam and Pap test. ?Lipid and cholesterol levels. ?Diabetes screening. This is done by checking your blood sugar (glucose) after you have not eaten for a while (fasting). ?Hepatitis  B test. ?Hepatitis C test. ?HIV (human immunodeficiency virus) test. ?STI (sexually transmitted infection) testing, if you are at risk. ?BRCA-related cancer screening. This may be done if you have a family history of breast, ovarian, tubal, or peritoneal cancers. ?Talk with your health care provider about your test results, treatment options, and if necessary, the need for more tests. ?Follow these instructions at home: ?Eating and drinking ? ?Eat a healthy diet that includes fresh fruits and vegetables, whole grains, lean protein, and low-fat dairy products. ?Take vitamin and mineral supplements as recommended by your health care provider. ?Do not drink alcohol if: ?Your health care provider tells you not to drink. ?You are pregnant, may be pregnant, or are planning to become pregnant. ?If you drink alcohol: ?Limit how much you have to 0-1 drink a day. ?Know how much alcohol is in your drink. In the [place] one drink equals one 12 oz bottle of beer (355 mL), one 5 oz glass of wine (148 mL), or one 1? oz glass of hard liquor (44 mL). ?Lifestyle ?Brush your teeth every morning and night with fluoride toothpaste. Floss one time each day. ?Exercise for at least 30 minutes 5 or more days each week. ?Do not use any products that contain nicotine or tobacco. These products include cigarettes, chewing tobacco, and vaping devices, such as e-cigarettes. If you need help quitting, ask your health care provider. ?Do not use drugs. ?If you are sexually active, practice safe sex. Use a condom or other form of protection to prevent STIs. ?If you do not wish to become pregnant, use a form of birth control. If you plan to become pregnant, see your health care provider for a prepregnancy visit. ?Find healthy ways to manage stress, such as: ?Meditation,   yoga, or listening to music. ?Journaling. ?Talking to a trusted person. ?Spending time with friends and family. ?Minimize exposure to UV radiation to reduce your risk of skin  cancer. ?Safety ?Always wear your seat belt while driving or riding in a vehicle. ?Do not drive: ?If you have been drinking alcohol. Do not ride with someone who has been drinking. ?If you have been using any mind-altering substances or drugs. ?While texting. ?When you are tired or distracted. ?Wear a helmet and other protective equipment during sports activities. ?If you have firearms in your house, make sure you follow all gun safety procedures. ?Seek help if you have been physically or sexually abused. ?What's next? ?Go to your health care provider once a year for an annual wellness visit. ?Ask your health care provider how often you should have your eyes and teeth checked. ?Stay up to date on all vaccines. ?This information is not intended to replace advice given to you by your health care provider. Make sure you discuss any questions you have with your health care provider. ?Document Revised: 05/24/2021 Document Reviewed: 05/24/2021 ?Elsevier Patient Education ? [place]. ? ?

## 2022-04-12 ENCOUNTER — Encounter: Payer: 59 | Admitting: Physician Assistant

## 2022-04-12 DIAGNOSIS — Z23 Encounter for immunization: Secondary | ICD-10-CM

## 2022-06-18 ENCOUNTER — Other Ambulatory Visit: Payer: Self-pay | Admitting: Physician Assistant

## 2022-06-18 DIAGNOSIS — Z30011 Encounter for initial prescription of contraceptive pills: Secondary | ICD-10-CM

## 2022-07-11 ENCOUNTER — Telehealth: Payer: Self-pay | Admitting: Physician Assistant

## 2022-07-11 NOTE — Telephone Encounter (Signed)
Patient requesting requesting refill of her birth control medication. Please advise.

## 2022-07-11 NOTE — Telephone Encounter (Signed)
Patient aware and appointment made

## 2022-07-11 NOTE — Telephone Encounter (Signed)
Too soon for refills.   Patient needs to schedule an appointment for further medications. AS, CMA

## 2022-07-17 ENCOUNTER — Ambulatory Visit (INDEPENDENT_AMBULATORY_CARE_PROVIDER_SITE_OTHER): Payer: 59 | Admitting: Physician Assistant

## 2022-07-17 ENCOUNTER — Encounter: Payer: Self-pay | Admitting: Physician Assistant

## 2022-07-17 VITALS — BP 110/70 | HR 56 | Temp 97.7°F | Resp 17 | Ht 65.0 in | Wt 134.0 lb

## 2022-07-17 DIAGNOSIS — Z3041 Encounter for surveillance of contraceptive pills: Secondary | ICD-10-CM

## 2022-07-17 DIAGNOSIS — F419 Anxiety disorder, unspecified: Secondary | ICD-10-CM

## 2022-07-17 MED ORDER — NORGESTIM-ETH ESTRAD TRIPHASIC 0.18/0.215/0.25 MG-35 MCG PO TABS: ORAL_TABLET | ORAL | 1 refills | Status: DC

## 2022-07-17 NOTE — Progress Notes (Signed)
Established patient visit   Patient: Kelli James   DOB: May 08, 2001   21 y.o. Female  MRN: [id number] Visit Date: 07/17/2022  Chief Complaint  Patient presents with   Contraception    Pt f/u on birth control no concerns, still has regular cycle    Depression    Pt went to [facility] and was rx fluoxetine 20 mg and is asking you take over prescribing this med    Referral    OBGYN for pap    Subjective    HPI HPI     Contraception    Additional comments: Pt f/u on birth control no concerns, still has regular cycle         Depression    Additional comments: Pt went to [facility] and was rx fluoxetine 20 mg and is asking you take over prescribing this med         Referral    Additional comments: OBGYN for pap       Last edited by Eldred Manges, CMA on 07/17/2022 10:38 AM.      Patient presents for follow-up on medication management. Patient denies missing doses of Tri-Sprintec. Patient reports had a severe anxiety/panic attack back in March and decided to go to [facility]. She has started therapy sessions every 2-3 weeks. Was started on Prozac 10 mg and increased to 20 mg. Tolerating Prozac 20 mg without issues. Mood has been stable.        Medications: Outpatient Medications Prior to Visit  Medication Sig   cetirizine (ZYRTEC) 10 MG tablet Take 10 mg by mouth daily.   FLUoxetine (PROZAC) 10 MG capsule Take 10 mg by mouth daily.   [DISCONTINUED] Norgestimate-Ethinyl Estradiol Triphasic (TRI-SPRINTEC) 0.18/0.215/0.25 MG-35 MCG tablet APT NEEDED FOR REFILLS TAKE 1 TABLET BY MOUTH ONCE DAILY   No facility-administered medications prior to visit.    Review of Systems Review of Systems:  A fourteen system review of systems was performed and found to be positive as per HPI.  Last CBC Lab Results  Component Value Date   WBC 5.3 08/18/2018   HGB 12.7 08/18/2018   HCT 37.5 08/18/2018   MCV 88 08/18/2018   MCH 29.9 08/18/2018   RDW 11.5 (L)  08/18/2018   PLT 243 08/18/2018   Last metabolic panel Lab Results  Component Value Date   GLUCOSE 89 08/18/2018   NA 136 08/18/2018   K 3.9 08/18/2018   CL 101 08/18/2018   CO2 22 08/18/2018   BUN 10 08/18/2018   CREATININE 0.64 08/18/2018   GFRNONAA 130 08/18/2018   CALCIUM 9.6 08/18/2018   PROT 7.2 08/18/2018   ALBUMIN 4.3 08/18/2018   LABGLOB 2.9 08/18/2018   AGRATIO 1.5 08/18/2018   BILITOT 0.3 08/18/2018   ALKPHOS 51 08/18/2018   AST 22 08/18/2018   ALT 21 08/18/2018   Last lipids No results found for: "CHOL", "HDL", "LDLCALC", "LDLDIRECT", "TRIG", "CHOLHDL" Last hemoglobin A1c No results found for: "HGBA1C" Last thyroid functions Lab Results  Component Value Date   TSH 1.04 10/17/2017   Last vitamin D No results found for: "25OHVITD2", "25OHVITD3", "VD25OH"     07/17/2022   10:37 AM 12/13/2021    3:35 PM 04/28/2019    3:10 PM 08/18/2018    3:08 PM 02/14/2018   12:24 PM  Depression screen PHQ 2/9  Decreased Interest 0 0 0 0 0  Down, Depressed, Hopeless 0 0 0 0 0  PHQ - 2 Score 0 0 0 0 0  Altered  sleeping  0  0 0  Tired, decreased energy  0  0 1  Change in appetite  0  0 0  Feeling bad or failure about yourself   0  0 0  Trouble concentrating  0  0 0  Moving slowly or fidgety/restless  0  0 0  Suicidal thoughts  0  0 0  PHQ-9 Score  0  0 1  Difficult doing work/chores  Not difficult at all  Not difficult at all Not difficult at all      07/17/2022   10:37 AM 12/13/2021    3:35 PM 08/18/2018    4:19 PM 02/14/2018   12:24 PM  GAD 7 : Generalized Anxiety Score  Nervous, Anxious, on Edge 0 1 1 1   Control/stop worrying 0 0 1 1  Worry too much - different things 0 0 0 0  Trouble relaxing 0 0 0 0  Restless 0 0 0 0  Easily annoyed or irritable 0 0 2 1  Afraid - awful might happen 0 0 0 1  Total GAD 7 Score 0 1 4 4   Anxiety Difficulty  Not difficult at all Somewhat difficult Not difficult at all      Objective    BP 110/70   Pulse (!) 56   Temp 97.7 F (36.5  C) (Oral)   Resp 17   Ht 5\' 5"  (1.651 m)   Wt 134 lb (60.8 kg)   LMP 07/16/2022   SpO2 97%   BMI 22.30 kg/m  BP Readings from Last 3 Encounters:  07/17/22 110/70  12/13/21 116/77  08/18/18 110/70   Wt Readings from Last 3 Encounters:  07/17/22 134 lb (60.8 kg)  12/13/21 153 lb (69.4 kg)  08/18/18 144 lb 12.8 oz (65.7 kg) (76 %, Z= 0.69)*   * Growth percentiles are based on CDC (Girls, 2-20 Years) data.    Physical Exam  General:  Pleasant and cooperative, appropriate for stated age.  Neuro:  Alert and oriented,  extra-ocular muscles intact  HEENT:  Normocephalic, atraumatic, neck supple  Skin:  no gross rash, warm, pink. Cardiac:  RRR, S1 S2 Respiratory: CTA B/L  Vascular:  Ext warm, no cyanosis apprec.; cap RF less 2 sec. Psych:  No HI/SI, judgement and insight good, Euthymic mood. Full Affect.   No results found for any visits on 07/17/22.  Assessment & Plan      Problem List Items Addressed This Visit       Other   Anxiety   Relevant Medications   FLUoxetine (PROZAC) 10 MG capsule   Other Visit Diagnoses     Oral contraceptive pill surveillance    -  Primary   Relevant Medications   Norgestimate-Ethinyl Estradiol Triphasic (TRI-SPRINTEC) 0.18/0.215/0.25 MG-35 MCG tablet      Anxiety: -Stable. Recommend to continue [facility] therapy sessions. Recommend to continue Prozac 20 mg and advised to let me know when she needs additional refills and will provide them. Pt verbalized understanding. Will continue to monitor.  Oral contraceptive pill surveillance: -Advised patient to schedule annual physical and pap as next appointment in 3 months. Provided medication refills for OCP.  Return in about 3 months (around 10/17/2022) for CPE w/ PAP.        01-07-1982, PA-C  [facility] at [facility] [phone] (phone) [phone] (fax)  [facility]

## 2022-07-17 NOTE — Patient Instructions (Signed)
Oral Contraception Use Oral contraceptive pills (OCPs) are medicines that prevent pregnancy. OCPs work by: Preventing the ovaries from releasing eggs. Thickening mucus in the lower part of the uterus (cervix). This prevents sperm from entering the uterus. Thinning the lining of the uterus (endometrium). This prevents a fertilized egg from attaching to the endometrium. Discuss possible side effects of OCPs with your health care provider. It can take 2-3 months for your body to adjust to changes in hormone levels. What are the risks? OCPs can sometimes cause side effects, such as: Headache. Depression. Trouble sleeping. Nausea and vomiting. Breast tenderness. Irregular bleeding or spotting during the first several months. Bloating or fluid retention. Increase in blood pressure. OCPs with estrogen and progestins may slightly increase the risk of: Blood clots. Heart attack. Stroke. How to take OCPs Follow instructions from your health care provider about how to take your first cycle of OCPs. There are 2 types of OCPs. The first, combination OCPs, have both estrogen and progestins. The second, progestin-only pills, have only progestin. For combination OCPs, you may start the pill: On day 1 of your menstrual period. On the first Sunday after your period starts, or on the day you get your prescription. At any time of your cycle. If you start taking the pill within 5 days after the start of your period, you will not need a backup form of birth control, such as condoms. If you start at any other time of your menstrual cycle, you will need to use a backup form of birth control. For progestin-only OCPs: Ideally, you can start taking the pill on the first day of your menstrual period, but you can start it on any other day too. These pills will protect you from pregnancy after taking it for 2 days (48 hours). You can stop using a backup form of birth control after that time. It is important that you  take this pill at the same time every day. Even taking it 3 hours late can increase the risk of pregnancy. No matter which day you start the OCP, you will always start a new pack on that same day of the week. Have an extra pack of OCPs and a backup contraceptive method available in case you miss some pills or lose your OCP pack. Missed doses Follow instructions from your health care provider for missed doses. Information about missed doses can also be found in the patient information sheet that comes with your pack of pills. In general, for combined OCPs: If you forget to take the pill for 1 day, take it as soon as you can. This may mean taking 2 pills on the same day and at the same time. Take the next day's pill at the regular time. If you forget to take the pill for 2 days in a row, take 2 tablets on the day you remember and 2 tablets on the following day. A backup form of birth control should be used for 7 days after you are back on schedule. If you forget to take the pill for 3 days in a row, call your health care provider for directions on when to restart taking your pills. Do not take the missed pills. A backup form of birth control will be needed for 7 days once you restart your pills. If you use a pack that contains inactive pills and you miss 1 or more of the inactive pills, you do not need to take the missed doses. Skip them and start the new   pack on the regular day. For progestin-only OCPs: If your dose is 3 hours or more late, or if you miss 1 or more doses, take 1 missed pill as soon as you can. If you miss one or more doses, you must use a backup form of birth control. Some brands of progestin-only pills recommend using a backup form of birth control for 48 hours after a missed or late dose while others recommend 7 days. If you are not sure what to do, call your health care provider or check the patient information sheet that came with your pills. Follow these instructions at home: Do not  use any products that contain nicotine or tobacco. These include cigarettes, chewing tobacco, or vaping devices, such as e-cigarettes. If you need help quitting, ask your health care provider. Always use a condom to protect against STIs (sexually transmitted infections). Oral contraception pills do not protect against STIs. Use a calendar to mark the days of your menstrual period. Read the information sheet and directions that came with your OCP. Talk to your health care provider if you have questions. Contact a health care provider if: You develop nausea and vomiting. You have abnormal vaginal discharge or bleeding. You develop a rash. You miss your menstrual period. Depending on the type of OCP you are taking, this may be a sign of pregnancy. You are losing your hair. You need treatment for mood swings or depression. You get dizzy when taking the OCP. You develop acne after taking the OCP. You become pregnant or think you may be pregnant. You have diarrhea, constipation, and abdominal pain or cramps. You are not sure what to do after missing pills. Get help right away if: You develop chest pain. You develop shortness of breath. You have an uncontrolled or severe headache. You develop numbness or slurred speech. You develop vision or speech problems. You develop pain, redness, and swelling in your legs. You develop weakness or numbness in your arms or legs. These symptoms may represent a serious problem that is an emergency. Do not wait to see if the symptoms will go away. Get medical help right away. Call your local emergency services (911 in the [place] Do not drive yourself to the hospital. Summary Oral contraceptive pills (OCPs) are medicines that you take to prevent pregnancy. OCPs do not prevent sexually transmitted infections (STIs). Always use a condom to protect against STIs. When you start an OCP, be aware that it can take 2-3 months for your body to adjust to changes in hormone  levels. Read all the information and directions that come with your OCP. This information is not intended to replace advice given to you by your health care provider. Make sure you discuss any questions you have with your health care provider. Document Revised: 08/04/2020 Document Reviewed: 08/04/2020 Elsevier Patient Education  2023 [employer]  

## 2022-09-03 ENCOUNTER — Other Ambulatory Visit: Payer: Self-pay | Admitting: Physician Assistant

## 2022-10-17 ENCOUNTER — Other Ambulatory Visit (HOSPITAL_COMMUNITY)
Admission: RE | Admit: 2022-10-17 | Discharge: 2022-10-17 | Disposition: A | Discharge status: Home / Self Care | Payer: 59 | Source: Ambulatory Visit | Attending: Physician Assistant | Admitting: Physician Assistant

## 2022-10-17 ENCOUNTER — Encounter: Payer: Self-pay | Admitting: Physician Assistant

## 2022-10-17 ENCOUNTER — Ambulatory Visit (INDEPENDENT_AMBULATORY_CARE_PROVIDER_SITE_OTHER): Payer: 59 | Admitting: Physician Assistant

## 2022-10-17 VITALS — BP 119/79 | HR 66 | Resp 18 | Ht 65.0 in | Wt 140.1 lb

## 2022-10-17 DIAGNOSIS — Z0000 Encounter for general adult medical examination without abnormal findings: Secondary | ICD-10-CM

## 2022-10-17 DIAGNOSIS — Z23 Encounter for immunization: Secondary | ICD-10-CM

## 2022-10-17 DIAGNOSIS — Z124 Encounter for screening for malignant neoplasm of cervix: Secondary | ICD-10-CM

## 2022-10-17 DIAGNOSIS — Z3041 Encounter for surveillance of contraceptive pills: Secondary | ICD-10-CM

## 2022-10-17 NOTE — Progress Notes (Signed)
Complete physical exam   Patient: Kelli James   DOB: Aug 02, 2001   21 y.o. Female  MRN: [id number] Visit Date: 10/17/2022   Chief Complaint  Patient presents with   Annual Exam    Non-Fasting   Subjective    Kelli James is a 21 y.o. female who presents today for a complete physical exam.  She reports consuming a general diet. The patient has a physically strenuous job, but has no regular exercise apart from work.  She generally feels well. She does not have additional problems to discuss today.     Past Medical History:  Diagnosis Date   Allergic rhinitis    Asthma    Past Surgical History:  Procedure Laterality Date   WISDOM TOOTH EXTRACTION  2016   Social History   Socioeconomic History   Marital status: Single    Spouse name: Not on file   Number of children: 0   Years of education: Not on file   Highest education level: High school graduate  Occupational History   Occupation: nanny    Comment: planning on starting college  Tobacco Use   Smoking status: Never   Smokeless tobacco: Never  Vaping Use   Vaping Use: Never used  Substance and Sexual Activity   Alcohol use: No   Drug use: No   Sexual activity: Yes    Partners: Male    Birth control/protection: OCP  Other Topics Concern   Not on file  Social History Narrative   Not on file   Social Determinants of Health   Financial Resource Strain: Not on file  Food Insecurity: Not on file  Transportation Needs: Not on file  Physical Activity: Not on file  Stress: Not on file  Social Connections: Not on file  Intimate Partner Violence: Not on file     Medications: Outpatient Medications Prior to Visit  Medication Sig   cetirizine (ZYRTEC) 10 MG tablet Take 10 mg by mouth daily.   FLUoxetine (PROZAC) 10 MG capsule Take 10 mg by mouth daily.   FLUoxetine (PROZAC) 20 MG capsule TAKE 1 CAPSULE BY MOUTH DAILY   Norgestimate-Ethinyl Estradiol Triphasic (TRI-SPRINTEC) 0.18/0.215/0.25 MG-35  MCG tablet TAKE 1 TABLET BY MOUTH ONCE DAILY   No facility-administered medications prior to visit.    Review of Systems Review of Systems:  A fourteen system review of systems was performed and found to be positive as per HPI.  Last CBC Lab Results  Component Value Date   WBC 5.3 08/18/2018   HGB 12.7 08/18/2018   HCT 37.5 08/18/2018   MCV 88 08/18/2018   MCH 29.9 08/18/2018   RDW 11.5 (L) 08/18/2018   PLT 243 99/83/3825   Last metabolic panel Lab Results  Component Value Date   GLUCOSE 89 08/18/2018   NA 136 08/18/2018   K 3.9 08/18/2018   CL 101 08/18/2018   CO2 22 08/18/2018   BUN 10 08/18/2018   CREATININE 0.64 08/18/2018   GFRNONAA 130 08/18/2018   CALCIUM 9.6 08/18/2018   PROT 7.2 08/18/2018   ALBUMIN 4.3 08/18/2018   LABGLOB 2.9 08/18/2018   AGRATIO 1.5 08/18/2018   BILITOT 0.3 08/18/2018   ALKPHOS 51 08/18/2018   AST 22 08/18/2018   ALT 21 08/18/2018   Last lipids No results found for: "CHOL", "HDL", "LDLCALC", "LDLDIRECT", "TRIG", "CHOLHDL" Last hemoglobin A1c No results found for: "HGBA1C" Last thyroid functions Lab Results  Component Value Date   TSH 1.04 10/17/2017      Objective  BP 119/79 (BP Location: Right Arm, Patient Position: Sitting, Cuff Size: Normal)   Pulse 66   Resp 18   Ht _0  (1.651 m)   Wt 140 lb 1.9 oz (63.6 kg)   LMP 10/01/2022 (Exact Date)   SpO2 98%   BMI 23.32 kg/m  BP Readings from Last 3 Encounters:  10/17/22 119/79  07/17/22 110/70  12/13/21 116/77   Wt Readings from Last 3 Encounters:  10/17/22 140 lb 1.9 oz (63.6 kg)  07/17/22 134 lb (60.8 kg)  12/13/21 153 lb (69.4 kg)      Physical Exam   General Appearance:    Well developed, well nourished female. Alert, cooperative, in no acute distress, appears stated age   Head:    Normocephalic, without obvious abnormality, atraumatic  Eyes:    PERRL, conjunctiva/corneas clear, EOM's intact, fundi    benign, both eyes  Ears:    Normal TM's and external  ear canals, both ears  Nose:   Nares normal, septum midline, mucosa normal, no drainage    or sinus tenderness  Throat:   Lips, mucosa, and tongue normal; teeth and gums normal  Neck:   Supple, symmetrical, trachea midline, no adenopathy;    thyroid:  no enlargement/tenderness/nodules; no carotid   bruit or JVD  Back:     Symmetric, no curvature, ROM normal, no CVA tenderness  Lungs:     Clear to auscultation bilaterally, respirations unlabored  Chest Wall:    No tenderness or deformity   Heart:    Normal heart rate. Normal rhythm. No murmurs, rubs, or gallops.   Breast Exam:    deferred  Abdomen:     Soft, non-tender, bowel sounds active all four quadrants,    no masses, no organomegaly  Pelvic:    cervix normal in appearance, external genitalia normal, no cervical motion tenderness, uterus normal size, shape, and consistency, and vagina normal without discharge  Extremities:   All extremities are intact. No cyanosis or edema  Pulses:   2+ and symmetric all extremities  Skin:   Skin color, texture, turgor normal, no rashes or suspicious lesions  Lymph nodes:   Cervical, supraclavicular nodes normal  Neurologic:   CNII-XII grossly intact.     Last depression screening scores    10/17/2022    8:27 AM 07/17/2022   10:37 AM 12/13/2021    3:35 PM  PHQ 2/9 Scores  PHQ - 2 Score 0 0 0  PHQ- 9 Score 0  0   Last fall risk screening    10/17/2022    8:27 AM  [place] in the past year? 0  Number falls in past yr: 0  Injury with Fall? 0     No results found for any visits on 10/17/22.  Assessment & Plan    Routine Health Maintenance and Physical Exam  Exercise Activities and Dietary recommendations -A heart healthy diet low in fat and carbohydrates. Recommend moderate exercise 150 mins/wk.  Immunization History  Administered Date(s) Administered   DTaP 03/29/1999, 06/08/1999, 07/26/1999, 05/09/2000, 04/07/2004   HIB (PRP-OMP) 03/29/1999, 06/08/1999, 07/26/1999,  01/29/2000   HPV 9-valent 01/04/2016, 03/21/2016, 07/18/2016   Hepatitis A 05/23/2010, 08/21/2011   Hepatitis B 03/29/1999, 06/08/1999, 07/26/1999   IPV 03/29/1999, 06/03/1999, 01/29/2000, 04/07/2004   Influenza Split 08/18/2018   Influenza,inj,Quad PF,6+ Mos 08/18/2018, 08/10/2019   Influenza-Unspecified 08/18/2017   MMR 01/28/2010, 04/07/2010   Meningococcal Conjugate 05/23/2010, 04/26/2014   PFIZER(Purple Top)SARS-COV-2 Vaccination 07/27/2020, 08/17/2020   Pneumococcal-Unspecified 01/29/2000, 05/09/2000  Tdap 05/23/2010, 10/17/2022   Varicella 09/16/2001, 11/08/2006    Health Maintenance  Topic Date Due   PAP-Cervical Cytology Screening  Never done   PAP SMEAR-Modifier  Never done   COVID-19 Vaccine (3 - Pfizer series) 10/12/2020   INFLUENZA VACCINE  03/10/2023 (Originally 07/10/2022)   CHLAMYDIA SCREENING  07/18/2023 (Originally 08/19/2019)   Hepatitis C Screening  07/18/2023 (Originally 01/23/2017)   TETANUS/TDAP  10/17/2032   HPV VACCINES  Completed   HIV Screening  Completed    Discussed health benefits of physical activity, and encouraged her to engage in regular exercise appropriate for her age and condition.  Problem List Items Addressed This Visit   None Visit Diagnoses     Healthcare maintenance    -  Primary   Relevant Orders   GC/Chlamydia Probe Amp   Cytology - PAP( [place])   Tdap vaccine greater than or equal to 7yo IM (Completed)   Oral contraceptive pill surveillance       Screening for cervical cancer       Relevant Orders   GC/Chlamydia Probe Amp   Cytology - PAP( [place])   Need for Tdap vaccination       Relevant Orders   Tdap vaccine greater than or equal to 7yo IM (Completed)      Pap collected. Patient on OCP, will collect GC-chlamydia screening. Pt agreeable to Tdap. Deferred flu vaccine and Hep C screening.   Return in about 6 months (around 04/17/2023) for mood.       Lorrene Reid, PA-C  [facility] at [facility] [phone] (phone) [phone] (fax)  [place]

## 2022-10-19 LAB — GC/CHLAMYDIA PROBE AMP
Chlamydia trachomatis, NAA: NEGATIVE
Neisseria Gonorrhoeae by PCR: NEGATIVE

## 2022-10-19 LAB — CYTOLOGY - PAP
Adequacy: ABSENT
Comment: NEGATIVE
Diagnosis: NEGATIVE
High risk HPV: NEGATIVE

## 2023-03-11 ENCOUNTER — Encounter: Payer: 59 | Admitting: Family Medicine

## 2023-03-19 LAB — OB RESULTS CONSOLE HEPATITIS B SURFACE ANTIGEN: Hepatitis B Surface Ag: NEGATIVE

## 2023-03-19 LAB — OB RESULTS CONSOLE RPR: RPR: NONREACTIVE

## 2023-03-19 LAB — OB RESULTS CONSOLE ANTIBODY SCREEN: Antibody Screen: NEGATIVE

## 2023-03-19 LAB — OB RESULTS CONSOLE ABO/RH: RH Type: POSITIVE

## 2023-03-19 LAB — OB RESULTS CONSOLE HIV ANTIBODY (ROUTINE TESTING): HIV: NONREACTIVE

## 2023-03-19 LAB — HEPATITIS C ANTIBODY: HCV Ab: NEGATIVE

## 2023-03-19 LAB — OB RESULTS CONSOLE RUBELLA ANTIBODY, IGM: Rubella: IMMUNE

## 2023-04-02 LAB — OB RESULTS CONSOLE GC/CHLAMYDIA
Chlamydia: NEGATIVE
Neisseria Gonorrhea: NEGATIVE

## 2023-04-17 ENCOUNTER — Ambulatory Visit: Payer: 59 | Admitting: Family Medicine

## 2023-04-17 NOTE — Progress Notes (Deleted)
   Established Patient Office Visit  Subjective   Patient ID: [name], female    DOB: February 02, 2001  Age: 22 y.o. MRN: [id number]  No chief complaint on file.   HPI Kelli James is a 22 y.o. female presenting today for follow up of mood. Mood: Patient is here to follow up for anxiety and depression, currently managing with Prozac 20 mg daily. Taking medication without side effects, reports {excellent/good/fair/poor:19665} compliance with treatment. Denies mood changes or SI/HI. She feels mood is {improved/worse/stable:29130} since last visit. Denies chest pain, difficulty concentrating, dizziness, fatigue, insomnia, irritability, palpitations, panic attacks, racing thoughts, SOB, sweating. Denies anhedonia, depressed mood, difficulty concentrating, fatigue, feelings of worthlessness/guilt, hopelessness, hypersomnia, impaired memory, insomnia, psychomotor agitation, psychomotor retardation, recurrent thoughts of death, weight changes.     10/20/22    8:27 AM 07/17/2022   10:37 AM 12/13/2021    3:35 PM  Depression screen PHQ 2/9  Decreased Interest 0 0 0  Down, Depressed, Hopeless 0 0 0  PHQ - 2 Score 0 0 0  Altered sleeping 0  0  Tired, decreased energy 0  0  Change in appetite 0  0  Feeling bad or failure about yourself  0  0  Trouble concentrating 0  0  Moving slowly or fidgety/restless 0  0  Suicidal thoughts 0  0  PHQ-9 Score 0  0  Difficult doing work/chores Not difficult at all  Not difficult at all       10-20-2022    8:27 AM 07/17/2022   10:37 AM 12/13/2021    3:35 PM 08/18/2018    4:19 PM  GAD 7 : Generalized Anxiety Score  Nervous, Anxious, on Edge 0 0 1 1  Control/stop worrying 0 0 0 1  Worry too much - different things 0 0 0 0  Trouble relaxing 0 0 0 0  Restless 0 0 0 0  Easily annoyed or irritable 0 0 0 2  Afraid - awful might happen 0 0 0 0  Total GAD 7 Score 0 0 1 4  Anxiety Difficulty Not difficult at all  Not difficult at all Somewhat difficult    ROS Negative unless otherwise noted in HPI   Objective:     There were no vitals taken for this visit.  Physical Exam   Assessment & Plan:  GAD (generalized anxiety disorder) Assessment & Plan: GAD-7 score of.  Continue Prozac 20 mg daily.  Will continue to monitor.   Mild episode of recurrent major depressive disorder (HCC) Assessment & Plan: PHQ-9 score of.  Continue Prozac 20 mg daily.  Will continue to monitor.     No follow-ups on file.    Melida Quitter, PA

## 2023-04-17 NOTE — Assessment & Plan Note (Deleted)
PHQ-9 score of.  Continue Prozac 20 mg daily.  Will continue to monitor.

## 2023-04-17 NOTE — Assessment & Plan Note (Deleted)
GAD-7 score of.  Continue Prozac 20 mg daily.  Will continue to monitor.

## 2023-10-03 LAB — OB RESULTS CONSOLE GBS: GBS: NEGATIVE

## 2023-10-25 ENCOUNTER — Telehealth (HOSPITAL_COMMUNITY): Payer: Self-pay | Admitting: *Deleted

## 2023-10-25 ENCOUNTER — Encounter (HOSPITAL_COMMUNITY): Payer: Self-pay | Admitting: *Deleted

## 2023-10-25 NOTE — Telephone Encounter (Signed)
Hello Kelli James   I am trying to reach you to review your Medical History and answer any questions you have regarding your Induction of Labor.  My number is [phone].  I am in the office from 8:30-4:30 Monday thru Friday.    Hardie Pulley MSN [facility] Preadmission Nurse

## 2023-10-28 ENCOUNTER — Encounter (HOSPITAL_COMMUNITY): Payer: Self-pay | Admitting: *Deleted

## 2023-10-29 ENCOUNTER — Inpatient Hospital Stay (HOSPITAL_COMMUNITY): Payer: 59 | Admitting: Anesthesiology

## 2023-10-29 ENCOUNTER — Encounter (HOSPITAL_COMMUNITY): Payer: Self-pay | Admitting: Obstetrics and Gynecology

## 2023-10-29 ENCOUNTER — Inpatient Hospital Stay (HOSPITAL_COMMUNITY)
Admission: RE | Admit: 2023-10-29 | Discharge: 2023-10-31 | DRG: 768 | Disposition: A | Discharge status: Home / Self Care | Payer: 59 | Attending: Obstetrics and Gynecology | Admitting: Obstetrics and Gynecology

## 2023-10-29 ENCOUNTER — Other Ambulatory Visit: Payer: Self-pay

## 2023-10-29 DIAGNOSIS — Z8261 Family history of arthritis: Secondary | ICD-10-CM | POA: Diagnosis not present

## 2023-10-29 DIAGNOSIS — O480 Post-term pregnancy: Principal | ICD-10-CM | POA: Diagnosis present

## 2023-10-29 DIAGNOSIS — Z8249 Family history of ischemic heart disease and other diseases of the circulatory system: Secondary | ICD-10-CM

## 2023-10-29 DIAGNOSIS — Z818 Family history of other mental and behavioral disorders: Secondary | ICD-10-CM | POA: Diagnosis not present

## 2023-10-29 DIAGNOSIS — O134 Gestational [pregnancy-induced] hypertension without significant proteinuria, complicating childbirth: Secondary | ICD-10-CM | POA: Diagnosis present

## 2023-10-29 DIAGNOSIS — Z3A40 40 weeks gestation of pregnancy: Secondary | ICD-10-CM | POA: Diagnosis not present

## 2023-10-29 DIAGNOSIS — O26893 Other specified pregnancy related conditions, third trimester: Secondary | ICD-10-CM | POA: Diagnosis present

## 2023-10-29 LAB — CBC
HCT: 38.4 % (ref 36.0–46.0)
Hemoglobin: 12.6 g/dL (ref 12.0–15.0)
MCH: 29.4 pg (ref 26.0–34.0)
MCHC: 32.8 g/dL (ref 30.0–36.0)
MCV: 89.5 fL (ref 80.0–100.0)
Platelets: 156 10*3/uL (ref 150–400)
RBC: 4.29 MIL/uL (ref 3.87–5.11)
RDW: 12.9 % (ref 11.5–15.5)
WBC: 8.3 10*3/uL (ref 4.0–10.5)
nRBC: 0 % (ref 0.0–0.2)

## 2023-10-29 LAB — COMPREHENSIVE METABOLIC PANEL
ALT: 18 U/L (ref 0–44)
AST: 27 U/L (ref 15–41)
Albumin: 2.9 g/dL — ABNORMAL LOW (ref 3.5–5.0)
Alkaline Phosphatase: 173 U/L — ABNORMAL HIGH (ref 38–126)
Anion gap: 12 (ref 5–15)
BUN: 9 mg/dL (ref 6–20)
CO2: 16 mmol/L — ABNORMAL LOW (ref 22–32)
Calcium: 8.6 mg/dL — ABNORMAL LOW (ref 8.9–10.3)
Chloride: 105 mmol/L (ref 98–111)
Creatinine, Ser: 0.62 mg/dL (ref 0.44–1.00)
GFR, Estimated: >60 mL/min (ref >60)
Glucose, Bld: 141 mg/dL — ABNORMAL HIGH (ref 70–99)
Potassium: 3.4 mmol/L — ABNORMAL LOW (ref 3.5–5.1)
Sodium: 133 mmol/L — ABNORMAL LOW (ref 135–145)
Total Bilirubin: 0.6 mg/dL (ref <1.2)
Total Protein: 6.6 g/dL (ref 6.5–8.1)

## 2023-10-29 LAB — TYPE AND SCREEN
ABO/RH(D): A POS
Antibody Screen: NEGATIVE

## 2023-10-29 MED ORDER — LIDOCAINE HCL (PF) 1 % IJ SOLN
INTRAMUSCULAR | Status: DC | PRN
  Administered 2023-10-29: 8 mL via EPIDURAL
  Administered 2023-10-29: 2 mL via EPIDURAL

## 2023-10-29 MED ORDER — OXYTOCIN-SODIUM CHLORIDE 30-0.9 UT/500ML-% IV SOLN
2.5000 [IU]/h | INTRAVENOUS | Status: DC
  Administered 2023-10-29: 2.5 [IU]/h via INTRAVENOUS

## 2023-10-29 MED ORDER — ONDANSETRON HCL 4 MG/2ML IJ SOLN: 4.0000 mg | Freq: Four times a day (QID) | INTRAMUSCULAR | Status: DC | PRN

## 2023-10-29 MED ORDER — FENTANYL-BUPIVACAINE-NACL 0.5-0.125-0.9 MG/250ML-% EP SOLN
12.0000 mL/h | EPIDURAL | Status: DC | PRN
  Filled 2023-10-29: qty 250

## 2023-10-29 MED ORDER — PHENYLEPHRINE 80 MCG/ML (10ML) SYRINGE FOR IV PUSH (FOR BLOOD PRESSURE SUPPORT): 80.0000 ug | PREFILLED_SYRINGE | INTRAVENOUS | Status: DC | PRN

## 2023-10-29 MED ORDER — ACETAMINOPHEN 325 MG PO TABS: 650.0000 mg | ORAL_TABLET | ORAL | Status: DC | PRN

## 2023-10-29 MED ORDER — OXYCODONE-ACETAMINOPHEN 5-325 MG PO TABS: 2.0000 | ORAL_TABLET | ORAL | Status: DC | PRN

## 2023-10-29 MED ORDER — LACTATED RINGERS IV SOLN: INTRAVENOUS | Status: DC

## 2023-10-29 MED ORDER — OXYCODONE-ACETAMINOPHEN 5-325 MG PO TABS: 1.0000 | ORAL_TABLET | ORAL | Status: DC | PRN

## 2023-10-29 MED ORDER — OXYTOCIN-SODIUM CHLORIDE 30-0.9 UT/500ML-% IV SOLN
1.0000 m[IU]/min | INTRAVENOUS | Status: DC
  Administered 2023-10-29: 2 m[IU]/min via INTRAVENOUS
  Filled 2023-10-29: qty 500

## 2023-10-29 MED ORDER — LACTATED RINGERS IV SOLN: 500.0000 mL | Freq: Once | INTRAVENOUS | Status: DC

## 2023-10-29 MED ORDER — OXYTOCIN BOLUS FROM INFUSION
333.0000 mL | Freq: Once | INTRAVENOUS | Status: AC
  Administered 2023-10-29: 333 mL via INTRAVENOUS

## 2023-10-29 MED ORDER — FENTANYL-BUPIVACAINE-NACL 0.5-0.125-0.9 MG/250ML-% EP SOLN
EPIDURAL | Status: DC | PRN
  Administered 2023-10-29: 12 mL/h via EPIDURAL

## 2023-10-29 MED ORDER — EPHEDRINE 5 MG/ML INJ: 10.0000 mg | INTRAVENOUS | Status: DC | PRN

## 2023-10-29 MED ORDER — LIDOCAINE HCL (PF) 1 % IJ SOLN: 30.0000 mL | INTRAMUSCULAR | Status: DC | PRN

## 2023-10-29 MED ORDER — FLEET ENEMA RE ENEM: 1.0000 | ENEMA | RECTAL | Status: DC | PRN

## 2023-10-29 MED ORDER — SOD CITRATE-CITRIC ACID 500-334 MG/5ML PO SOLN: 30.0000 mL | ORAL | Status: DC | PRN

## 2023-10-29 MED ORDER — LACTATED RINGERS IV SOLN
500.0000 mL | INTRAVENOUS | Status: DC | PRN
Start: 2023-10-29 — End: 2023-10-30

## 2023-10-29 MED ORDER — DIPHENHYDRAMINE HCL 50 MG/ML IJ SOLN: 12.5000 mg | INTRAMUSCULAR | Status: DC | PRN

## 2023-10-29 MED ORDER — TERBUTALINE SULFATE 1 MG/ML IJ SOLN: 0.2500 mg | Freq: Once | INTRAMUSCULAR | Status: DC | PRN

## 2023-10-29 MED ORDER — FENTANYL CITRATE (PF) 100 MCG/2ML IJ SOLN
50.0000 ug | INTRAMUSCULAR | Status: DC | PRN
  Administered 2023-10-29: 100 ug via INTRAVENOUS
  Filled 2023-10-29: qty 2

## 2023-10-29 NOTE — Progress Notes (Signed)
Operative Delivery Note At 10:21 PM a viable female was delivered via Vaginal, Vacuum [employer] banker, operational).  Presentation: vertex; Position: Left,, Occiput,, Anterior; Station: +4.  Verbal consent: obtained from patient.  Risks and benefits discussed in detail.  Risks include, but are not limited to the risks of anesthesia, bleeding, infection, damage to maternal tissues, fetal cephalhematoma.  There is also the risk of inability to effect vaginal delivery of the head, or shoulder dystocia that cannot be resolved by established maneuvers, leading to the need for emergency cesarean section. FHT developed prolonged late component with pushes. VE recommended to shorten second stage. APGAR: 9, 9; weight 9 lb 10.2 oz (4370 g).   Placenta status:intact , .   Cord: 3 vessels with the following complications: none.     Anesthesia:  epidural Instruments: Kiwi, no pop offs, steady progress over 3 UCs Episiotomy: second degree midline with superficial third degree extension into capsule Lacerations:   Suture Repair: 2.0 vicryl rapide Est. Blood Loss (mL):  200  Mom to postpartum.  Baby to Couplet care / Skin to Skin.  Kelli James 10/29/2023, 10:49 PM

## 2023-10-29 NOTE — H&P (Signed)
Kelli James is a 22 y.o. female presenting for IOL. Pregnancy uncomplicated. No HA or vision change. OB History     Gravida  1   Para  0   Term  0   Preterm  0   AB  0   Living  0      SAB  0   IAB  0   Ectopic  0   Multiple  0   Live Births  0          Past Medical History:  Diagnosis Date   Allergic rhinitis    Asthma    Past Surgical History:  Procedure Laterality Date   WISDOM TOOTH EXTRACTION  2016   Family History: family history includes Arthritis in her paternal grandmother; Atrial fibrillation in her paternal grandfather; Bipolar disorder in her mother; Depression in her mother and paternal grandfather; Diverticulitis in her father; Healthy in her sister; Heart disease in her paternal grandfather; Hypercholesterolemia in her paternal grandfather; Hypertension in her father and paternal grandfather; Lupus in her paternal grandmother; Thyroid disease in her paternal grandmother. Social History:  reports that she has never smoked. She has never used smokeless tobacco. She reports that she does not drink alcohol and does not use drugs.     Maternal Diabetes: No Genetic Screening: Normal Maternal Ultrasounds/Referrals: Normal Fetal Ultrasounds or other Referrals:  None Maternal Substance Abuse:  No Significant Maternal Medications:  None Significant Maternal Lab Results:  Group B Strep negative Number of Prenatal Visits:greater than 3 verified prenatal visits Maternal Vaccinations: Other Comments:  None  Review of Systems  Constitutional:  Negative for fever.  Eyes:  Negative for photophobia.  Gastrointestinal:  Negative for abdominal pain.  Neurological:  Negative for headaches.   Maternal Medical History:  Fetal activity: Perceived fetal activity is normal.     Dilation: 2 Effacement (%): 80 Station: -2 Exam by:: dr Ronan Duecker Blood pressure (!) 146/82, pulse 74, temperature 98.5 F (36.9 C), temperature source Oral, resp. rate 16,  height 5\' 5"  (1.651 m), weight 87.1 kg. Maternal Exam:  Abdomen: Fetal presentation: vertex   Fetal Exam Fetal State Assessment: Category I - tracings are normal.   Physical Exam Cardiovascular:     Rate and Rhythm: Normal rate.  Pulmonary:     Effort: Pulmonary effort is normal.     Prenatal labs: ABO, Rh: A/Positive/-- (04/09 0000) Antibody: Negative (04/09 0000) Rubella: Immune (04/09 0000) RPR: Nonreactive (04/09 0000)  HBsAg: Negative (04/09 0000)  HIV: Non-reactive (04/09 0000)  GBS: Negative/-- (10/24 0000)   Assessment/Plan: 22 yo G1P0 @ 40 2/7 weeks PIH with mild elevation of BP on admission CMET with labs D/W IOL. Will start Pitocin   Kelli James II 10/29/2023, 12:54 PM

## 2023-10-29 NOTE — Anesthesia Preprocedure Evaluation (Signed)
Anesthesia Evaluation  Patient identified by MRN, date of birth, ID band Patient awake    Reviewed: Allergy & Precautions, Patient's Chart, lab work & pertinent test results  Airway Mallampati: II  TM Distance: >3 FB Neck ROM: Full    Dental no notable dental hx.    Pulmonary asthma    Pulmonary exam normal breath sounds clear to auscultation       Cardiovascular negative cardio ROS Normal cardiovascular exam Rhythm:Regular Rate:Normal     Neuro/Psych  PSYCHIATRIC DISORDERS Anxiety Depression    negative neurological ROS     GI/Hepatic negative GI ROS, Neg liver ROS,,,  Endo/Other  BMI 32  Renal/GU negative Renal ROS  negative genitourinary   Musculoskeletal negative musculoskeletal ROS (+)    Abdominal   Peds negative pediatric ROS (+)  Hematology negative hematology ROS (+) Hb 12.6, plt 156   Anesthesia Other Findings   Reproductive/Obstetrics (+) Pregnancy                             Anesthesia Physical Anesthesia Plan  ASA: 2  Anesthesia Plan: Epidural   Post-op Pain Management:    Induction:   PONV Risk Score and Plan: 2  Airway Management Planned: Natural Airway  Additional Equipment: None  Intra-op Plan:   Post-operative Plan:   Informed Consent: I have reviewed the patients History and Physical, chart, labs and discussed the procedure including the risks, benefits and alternatives for the proposed anesthesia with the patient or authorized representative who has indicated his/her understanding and acceptance.       Plan Discussed with:   Anesthesia Plan Comments:        Anesthesia Quick Evaluation

## 2023-10-29 NOTE — Anesthesia Procedure Notes (Signed)

## 2023-10-30 ENCOUNTER — Encounter (HOSPITAL_COMMUNITY): Payer: Self-pay | Admitting: Obstetrics and Gynecology

## 2023-10-30 LAB — CBC
HCT: 30.6 % — ABNORMAL LOW (ref 36.0–46.0)
Hemoglobin: 10.2 g/dL — ABNORMAL LOW (ref 12.0–15.0)
MCH: 29.3 pg (ref 26.0–34.0)
MCHC: 33.3 g/dL (ref 30.0–36.0)
MCV: 87.9 fL (ref 80.0–100.0)
Platelets: 135 10*3/uL — ABNORMAL LOW (ref 150–400)
RBC: 3.48 MIL/uL — ABNORMAL LOW (ref 3.87–5.11)
RDW: 12.7 % (ref 11.5–15.5)
WBC: 11.4 10*3/uL — ABNORMAL HIGH (ref 4.0–10.5)
nRBC: 0 % (ref 0.0–0.2)

## 2023-10-30 LAB — RPR: RPR Ser Ql: NONREACTIVE

## 2023-10-30 MED ORDER — ONDANSETRON HCL 4 MG PO TABS: 4.0000 mg | ORAL_TABLET | ORAL | Status: DC | PRN

## 2023-10-30 MED ORDER — SENNOSIDES-DOCUSATE SODIUM 8.6-50 MG PO TABS: 2.0000 | ORAL_TABLET | ORAL | Status: DC

## 2023-10-30 MED ORDER — ONDANSETRON HCL 4 MG/2ML IJ SOLN: 4.0000 mg | INTRAMUSCULAR | Status: DC | PRN

## 2023-10-30 MED ORDER — IBUPROFEN 600 MG PO TABS
600.0000 mg | ORAL_TABLET | Freq: Four times a day (QID) | ORAL | Status: DC
  Administered 2023-10-30 – 2023-10-31 (×6): 600 mg via ORAL
  Filled 2023-10-30 (×6): qty 1

## 2023-10-30 MED ORDER — COCONUT OIL OIL: 1.0000 | TOPICAL_OIL | Status: DC | PRN

## 2023-10-30 MED ORDER — OXYCODONE HCL 5 MG PO TABS: 10.0000 mg | ORAL_TABLET | ORAL | Status: DC | PRN

## 2023-10-30 MED ORDER — BENZOCAINE-MENTHOL 20-0.5 % EX AERO
1.0000 | INHALATION_SPRAY | CUTANEOUS | Status: DC | PRN
  Administered 2023-10-30: 1 via TOPICAL
  Filled 2023-10-30: qty 56

## 2023-10-30 MED ORDER — OXYCODONE HCL 5 MG PO TABS: 5.0000 mg | ORAL_TABLET | ORAL | Status: DC | PRN

## 2023-10-30 MED ORDER — DIBUCAINE (PERIANAL) 1 % EX OINT: 1.0000 | TOPICAL_OINTMENT | CUTANEOUS | Status: DC | PRN

## 2023-10-30 MED ORDER — FLUOXETINE HCL 20 MG PO CAPS
20.0000 mg | ORAL_CAPSULE | Freq: Every day | ORAL | Status: DC
  Filled 2023-10-30: qty 1

## 2023-10-30 MED ORDER — TETANUS-DIPHTH-ACELL PERTUSSIS 5-2.5-18.5 LF-MCG/0.5 IM SUSY: 0.5000 mL | PREFILLED_SYRINGE | Freq: Once | INTRAMUSCULAR | Status: DC

## 2023-10-30 MED ORDER — ACETAMINOPHEN 325 MG PO TABS
650.0000 mg | ORAL_TABLET | ORAL | Status: DC | PRN
  Administered 2023-10-30 (×2): 650 mg via ORAL
  Filled 2023-10-30 (×2): qty 2

## 2023-10-30 MED ORDER — ZOLPIDEM TARTRATE 5 MG PO TABS: 5.0000 mg | ORAL_TABLET | Freq: Every evening | ORAL | Status: DC | PRN

## 2023-10-30 MED ORDER — DIPHENHYDRAMINE HCL 25 MG PO CAPS: 25.0000 mg | ORAL_CAPSULE | Freq: Four times a day (QID) | ORAL | Status: DC | PRN

## 2023-10-30 MED ORDER — PRENATAL MULTIVITAMIN CH
1.0000 | ORAL_TABLET | Freq: Every day | ORAL | Status: DC
  Administered 2023-10-30 – 2023-10-31 (×2): 1 via ORAL
  Filled 2023-10-30 (×2): qty 1

## 2023-10-30 MED ORDER — SIMETHICONE 80 MG PO CHEW
80.0000 mg | CHEWABLE_TABLET | ORAL | Status: DC | PRN
Start: 2023-10-30 — End: 2023-10-31

## 2023-10-30 MED ORDER — WITCH HAZEL-GLYCERIN EX PADS: 1.0000 | MEDICATED_PAD | CUTANEOUS | Status: DC | PRN

## 2023-10-30 NOTE — Progress Notes (Addendum)
Postpartum Progress Note  S: No complaints. Feeling well, tired. Lochia appropriate. No subjective fevers/chills.   O:     10/30/2023    5:27 AM 10/30/2023    1:50 AM 10/30/2023   12:50 AM  Vitals with BMI  Systolic 115 122 161  Diastolic 76 75 78  Pulse 91 85 86    Gen: NAD, A&O Pulm: NWOB Abd: soft, appropriately ttp, fundus firm and below Umb Ext: No evidence of DVT, trace edema b/l  Labs Recent Results (from the past 2160 hour(s))  OB RESULT CONSOLE Group B Strep     Status: None   Collection Time: 10/03/23 12:00 AM  Result Value Ref Range   GBS Negative   Type and screen [facility]     Status: None   Collection Time: 10/29/23  1:08 PM  Result Value Ref Range   ABO/RH(D) A POS    Antibody Screen NEG    Sample Expiration      11/01/2023,2359 Performed at [facility], [address] [place], [place] [address]   CBC     Status: None   Collection Time: 10/29/23  1:09 PM  Result Value Ref Range   WBC 8.3 4.0 - 10.5 K/uL   RBC 4.29 3.87 - 5.11 MIL/uL   Hemoglobin 12.6 12.0 - 15.0 g/dL   HCT 54.0 98.1 - 19.1 %   MCV 89.5 80.0 - 100.0 fL   MCH 29.4 26.0 - 34.0 pg   MCHC 32.8 30.0 - 36.0 g/dL   RDW 47.8 29.5 - 62.1 %   Platelets 156 150 - 400 K/uL   nRBC 0.0 0.0 - 0.2 %    Comment: Performed at [facility], [address] [place], [place] [address]  Comprehensive metabolic panel     Status: Abnormal   Collection Time: 10/29/23  1:09 PM  Result Value Ref Range   Sodium 133 (L) 135 - 145 mmol/L   Potassium 3.4 (L) 3.5 - 5.1 mmol/L   Chloride 105 98 - 111 mmol/L   CO2 16 (L) 22 - 32 mmol/L   Glucose, Bld 141 (H) 70 - 99 mg/dL    Comment: Glucose reference range applies only to samples taken after fasting for at least 8 hours.   BUN 9 6 - 20 mg/dL   Creatinine, Ser 7.84 0.44 - 1.00 mg/dL   Calcium 8.6 (L) 8.9 - 10.3 mg/dL   Total Protein 6.6 6.5 - 8.1 g/dL   Albumin 2.9 (L) 3.5 - 5.0 g/dL   AST 27 15 - 41 U/L   ALT 18 0 - 44  U/L   Alkaline Phosphatase 173 (H) 38 - 126 U/L   Total Bilirubin 0.6 <1.2 mg/dL   GFR, Estimated >69 >62 mL/min    Comment: (NOTE) Calculated using the CKD-EPI Creatinine Equation (2021)    Anion gap 12 5 - 15    Comment: Performed at [facility], [address], [place], [place] [address]  CBC     Status: Abnormal   Collection Time: 10/30/23  5:21 AM  Result Value Ref Range   WBC 11.4 (H) 4.0 - 10.5 K/uL   RBC 3.48 (L) 3.87 - 5.11 MIL/uL   Hemoglobin 10.2 (L) 12.0 - 15.0 g/dL   HCT 13.2 (L) 44.0 - 10.2 %   MCV 87.9 80.0 - 100.0 fL   MCH 29.3 26.0 - 34.0 pg   MCHC 33.3 30.0 - 36.0 g/dL   RDW 72.5 36.6 -  15.5 %   Platelets 135 (L) 150 - 400 K/uL   nRBC 0.0 0.0 - 0.2 %    Comment: Performed at [facility], [address], [place], [place] [address]     A/P:  PPD1 s/p VAVD, doing well pp. AFVSS. Benign exam.  gHTN - Bps normal since delivery, labs wnl. Asx. Closely monitor. Desires circ - reviewed r/b/a. Not yet cleared by peds, will perform when cleared. Continue present care. Plan for likely discharge tomorrow.  Jule Economy, MD

## 2023-10-30 NOTE — Lactation Note (Signed)
This note was copied from a baby's chart. Lactation Consultation Note  Patient Name: Kelli James [id number] Date: 10/30/2023 Age:24 hours  Attempted to see mom but everyone sleeping.   Maternal Data    Feeding    LATCH Score                    Lactation Tools Discussed/Used    Interventions    Discharge    Consult Status      Charyl Dancer 10/30/2023, 3:53 AM

## 2023-10-30 NOTE — Progress Notes (Signed)
MOB was referred for history of depression/anxiety.  * Referral screened out by Clinical Social Worker because none of the following criteria appear to apply:  ~ History of anxiety/depression during this pregnancy, or of post-partum depression following prior delivery.  ~ Diagnosis of anxiety and/or depression within last 3 years  Per OB notes, MOB did not indicate any signs/symptoms during her pregnancy  OR  * MOB's symptoms currently being treated with medication and/or therapy.   Please contact the Clinical Social Worker if needs arise, by [facility] request, or if MOB scores greater than 9/yes to question 10 on [place] Postpartum Depression Screen.  Enos Fling, Theresia Majors Clinical Social Worker [phone]

## 2023-10-30 NOTE — Lactation Note (Signed)
This note was copied from a baby's chart. Lactation Consultation Note  Patient Name: Kelli James [id number] Date: 10/30/2023 Age:24 hours  Reason for consult: Initial assessment;Primapara;1st time breastfeeding;Term  Initial LC visit to see P1 mother. Baby was calm and alert. Basic breastfeeding education with baby positioned in football hold. Mother taught hand expression. Baby latched and suckled in bursts. Increase colostrum noted when re-latching. Baby was on and off the breast for 10 minutes.   Mother encouraged to latch baby with feeding cues, place baby skin to skin if not latching, and call for assistance with breastfeeding as needed. Anticipate as baby approaches 24 hours of age, baby will most likely breastfeed more often,8-12 plus times in 24 hours, and may "cluster feed".     Mom made aware of O/P services, breastfeeding support groups, and our phone # for post-discharge questions. CDC guidelines for milk storage and preparation provided.   Maternal Data Has patient been taught Hand Expression?: Yes  Feeding Mother's Current Feeding Choice: Breast Milk and Formula  LATCH Score Latch: Repeated attempts needed to sustain latch, nipple held in mouth throughout feeding, stimulation needed to elicit sucking reflex.  Audible Swallowing: A few with stimulation  Type of Nipple: Everted at rest and after stimulation  Comfort (Breast/Nipple): Soft / non-tender  Hold (Positioning): Assistance needed to correctly position infant at breast and maintain latch.  LATCH Score: 7    Interventions Interventions: Breast feeding basics reviewed;Assisted with latch;Skin to skin;Breast compression;Adjust position;Support pillows;Position options;Education;LC Services brochure  Discharge  Mother has a personal pump and had questions related to pumping.  Consult Status Consult Status: Follow-up Date: 10/31/23    Omar Person 10/30/2023, 10:08 AM

## 2023-10-31 MED ORDER — IBUPROFEN 600 MG PO TABS: 600.0000 mg | ORAL_TABLET | Freq: Four times a day (QID) | ORAL | 0 refills | Status: DC

## 2023-10-31 MED ORDER — ACETAMINOPHEN 325 MG PO TABS: 650.0000 mg | ORAL_TABLET | ORAL | 0 refills | Status: DC | PRN

## 2023-10-31 MED ORDER — DOCUSATE SODIUM 100 MG PO CAPS: 100.0000 mg | ORAL_CAPSULE | Freq: Every day | ORAL | 0 refills | Status: DC

## 2023-10-31 NOTE — Lactation Note (Addendum)
This note was copied from a baby's chart. Lactation Consultation Note  Patient Name: Kelli James [id number] Date: 10/31/2023 Age:24 hours. P1  Reason for consult: Follow-up assessment;Primapara;1st time breastfeeding;Term;Infant weight loss (6 % weight loss) Per mom the baby is large and seemed hungry so I started giving formula. Baby in the tx nursery for a circ.  Mom expressed she may breast feed.  LC recommended since she seems to be on the fence with feeding preference and baby probably has gotten use to feeding from a bottle with more volume after D/C at home to working on the consistent pumping after the baby feeds 8-10 times a day.  If latching, offer the baby and appetizer of EBM or formula prior to latch so the baby is calmer to latch and supplement afterwards.  LC reassured mom usually baby's are happier at the breast once the milk comes in.  LC reviewed BF D/C teaching, ( see below for details )  Mom aware of the [facility] resources.    Maternal Data Has patient been taught Hand Expression?: Yes Does the patient have breastfeeding experience prior to this delivery?: No  Feeding Mother's Current Feeding Choice: Breast Milk and Formula Nipple Type: Slow - flow    Interventions Interventions: Breast feeding basics reviewed;LC Services brochure;CDC Guidelines for Breast Pump Cleaning  Discharge Discharge Education: Engorgement and breast care;Warning signs for feeding baby Pump: Personal;Hands Free (per mom Wilow)  Consult Status Consult Status: Complete Date: 10/31/23    Kathrin Greathouse 10/31/2023, 10:29 AM

## 2023-10-31 NOTE — Discharge Summary (Signed)
Obstetric Discharge Summary  Kelli James is a 22 y.o. female that presented on 10/29/2023 for induction of labor at 40w.  She was admitted to labor and delivery for induction.  Her labor course was uncomplicated and she delivered a viable female infant on 10/29/2023.  Her postpartum course was uncomplicated and on PPD#2, she reported well controlled pain, spontaneous voiding, ambulating without difficulty, and tolerating PO.  She was stable for discharge home on 10/31/2023 with plans for in-office follow up.  Hemoglobin  Date Value Ref Range Status  10/30/2023 10.2 (L) 12.0 - 15.0 g/dL Final  45/40/9811 91.4 11.1 - 15.9 g/dL Final   HCT  Date Value Ref Range Status  10/30/2023 30.6 (L) 36.0 - 46.0 % Final   Hematocrit  Date Value Ref Range Status  08/18/2018 37.5 34.0 - 46.6 % Final    Physical Exam:  General: alert and no distress Lochia: appropriate Uterine Fundus: firm DVT Evaluation: No evidence of DVT seen on physical exam.  Discharge Diagnoses: Term Pregnancy-delivered  Discharge Information: Date: 10/31/2023 Activity: Pelvic rest, as tolerated Diet: routine Medications: Tylenol, motrin Condition: stable Instructions: Refer to practice specific booklet.  Discussed prior to discharge.  Discharge to: Home  Follow-up Information     [place] Follow up.   Why: Please follow up for a 6 week postpartum visit. Contact information: [address] [place] [address] [phone]                 Newborn Data: Live born female  Birth Weight: 9 lb 10.2 oz (4370 g) APGAR: 9, 9  Newborn Delivery   Birth date/time: 10/29/2023 22:21:43 Delivery type: Vaginal, Vacuum (Extractor)     Home with mother.  Lyn Henri 10/31/2023, 5:10 PM

## 2023-10-31 NOTE — Anesthesia Postprocedure Evaluation (Signed)
Anesthesia Post Note  Patient: Kelli James  Procedure(s) Performed: AN AD HOC LABOR EPIDURAL     Patient location during evaluation: Mother Baby Anesthesia Type: Epidural Level of consciousness: awake and alert Pain management: pain level controlled Vital Signs Assessment: post-procedure vital signs reviewed and stable Respiratory status: spontaneous breathing, nonlabored ventilation and respiratory function stable Cardiovascular status: stable Postop Assessment: no headache, no backache and epidural receding Anesthetic complications: no   No notable events documented.  Last Vitals:  Vitals:   10/30/23 1945 10/31/23 0615  BP: 120/65 106/64  Pulse: 77 89  Resp: 17 17  Temp: 36.6 C 36.7 C  SpO2: 99% 98%    Last Pain:  Vitals:   10/31/23 0615  TempSrc: Oral  PainSc: 0-No pain   Pain Goal: Patients Stated Pain Goal: 0 (10/30/23 1913)                 Fanny Dance

## 2023-10-31 NOTE — Progress Notes (Signed)
Postpartum Progress Note  Post Partum Day 2 s/p spontaneous vaginal delivery.  Patient reports well-controlled pain, ambulating without difficulty, voiding spontaneously, tolerating PO.  Vaginal bleeding is appropriate.   Objective: Blood pressure 106/64, pulse 89, temperature 98.1 F (36.7 C), temperature source Oral, resp. rate 17, height 5\' 5"  (1.651 m), weight 87.1 kg, SpO2 98%, unknown if currently breastfeeding.  Physical Exam:  General: alert and no distress Lochia: appropriate Uterine Fundus: firm DVT Evaluation: No evidence of DVT seen on physical exam.  Recent Labs    10/29/23 1309 10/30/23 0521  HGB 12.6 10.2*  HCT 38.4 30.6*    Assessment/Plan: Postpartum Day 2, s/p vaginal delivery. Continue routine postpartum care Lactation following gHTN on admission, normotensive since delivery.  Baby boy - desires circ. Will perform if cleared by nursery.  Anticipate discharge home today   LOS: 2 days   Kelli James 10/31/2023, 7:27 AM

## 2023-11-03 ENCOUNTER — Inpatient Hospital Stay (HOSPITAL_COMMUNITY): Payer: 59

## 2023-11-08 ENCOUNTER — Telehealth (HOSPITAL_COMMUNITY): Payer: Self-pay | Admitting: *Deleted

## 2023-11-08 NOTE — Telephone Encounter (Signed)
Attempted hospital discharge follow-up call. Left message for patient to return RN call with any questions or concerns. Deforest Hoyles, RN, 11/08/23, [phone]

## 2024-01-04 DIAGNOSIS — Z6828 Body mass index (BMI) 28.0-28.9, adult: Secondary | ICD-10-CM | POA: Diagnosis not present

## 2024-01-04 DIAGNOSIS — J09X2 Influenza due to identified novel influenza A virus with other respiratory manifestations: Secondary | ICD-10-CM | POA: Diagnosis not present

## 2024-01-04 DIAGNOSIS — Z20822 Contact with and (suspected) exposure to covid-19: Secondary | ICD-10-CM | POA: Diagnosis not present

## 2024-03-25 ENCOUNTER — Encounter: Payer: Self-pay | Admitting: Family Medicine

## 2024-03-25 ENCOUNTER — Ambulatory Visit (INDEPENDENT_AMBULATORY_CARE_PROVIDER_SITE_OTHER): Admitting: Family Medicine

## 2024-03-25 VITALS — BP 119/76 | HR 64 | Ht 65.0 in | Wt 167.4 lb

## 2024-03-25 DIAGNOSIS — Z3009 Encounter for other general counseling and advice on contraception: Secondary | ICD-10-CM

## 2024-03-25 NOTE — Progress Notes (Signed)
   Established Patient Office Visit  Subjective   Patient ID: Kelli James, female    DOB: 2001/02/01  Age: 23 y.o. MRN: [id number]  Chief Complaint  Patient presents with   Medical Management of Chronic Issues    HPI Patient here for counseling on birth control.  She currently takes a progesterone only pill.  She is breast-feeding.  Her child is approximately 62 months old.  Unsure when she is going to quit breast-feeding.  We discussed other options including IUDs and Nexplanon.  Patient has Medicaid now and [facility] so she needs new referral for gynecology.  The ASCVD Risk score (Arnett DK, et al., 2019) failed to calculate for the following reasons:   The 2019 ASCVD risk score is only valid for ages 39 to 73  Health Maintenance Due  Topic Date Due   Pneumococcal Vaccine 16-50 Years old (1 of 2 - PCV) 01/23/2018   COVID-19 Vaccine (3 - 2024-25 season) 08/11/2023      Objective:     BP 119/76   Pulse 64   Ht 5\' 5"  (1.651 m)   Wt 167 lb 6.4 oz (75.9 kg)   SpO2 97%   BMI 27.86 kg/m    Physical Exam General: Alert, oriented Pulmonary: No respiratory distress Psych: Pleasant affect   No results found for any visits on 03/25/24.      Assessment & Plan:   Birth control counseling Assessment & Plan: Currently taking progesterone only pill.  Currently breast-feeding 38-month-old infant.  Concerned about estrogens effect on milk production.  Previous gynecologist office does not take Medicaid which patient now has.  Will send in new referral to gynecologist that takes Medicaid  Orders: -     Ambulatory referral to Gynecology     Return in about 1 year (around 03/25/2025) for physical.    Laneta Pintos, MD

## 2024-03-25 NOTE — Patient Instructions (Signed)
 It was nice to see you today,  We addressed the following topics today: -I will send in a referral to the gynecologist who takes Medicaid. - If you decide you want to switch to estrogen containing birth control let us  know and we can send in the prescription - If you would like an IUD or Nexplanon then the gynecologist would be able to place those for you.   Have a great day,  Etha Henle, MD

## 2024-03-31 DIAGNOSIS — Z3009 Encounter for other general counseling and advice on contraception: Secondary | ICD-10-CM | POA: Insufficient documentation

## 2024-03-31 NOTE — Assessment & Plan Note (Signed)
 Currently taking progesterone only pill.  Currently breast-feeding 61-month-old infant.  Concerned about estrogens effect on milk production.  Previous gynecologist office does not take Medicaid which patient now has.  Will send in new referral to gynecologist that takes Medicaid

## 2024-04-27 ENCOUNTER — Telehealth: Payer: Self-pay

## 2024-04-27 NOTE — Telephone Encounter (Signed)
 Copied from CRM [id number]. Topic: Clinical - Medication Question >> Apr 27, 2024 11:01 AM Lizabeth Riggs wrote: Reason for CRM:  Monchel wants to switch change her birth control to a combo pill. Please call her at [phone] to discuss. Her last appointment was on 03/25/24.

## 2024-04-28 ENCOUNTER — Other Ambulatory Visit: Payer: Self-pay | Admitting: Family Medicine

## 2024-04-28 MED ORDER — NORGESTIMATE-ETH ESTRADIOL 0.25-35 MG-MCG PO TABS: 1.0000 | ORAL_TABLET | Freq: Every day | ORAL | 11 refills | Status: DC

## 2024-04-28 NOTE — Telephone Encounter (Signed)
 I have sent in a prescription for sprintec

## 2024-04-29 NOTE — Telephone Encounter (Signed)
 Pt informed of below.

## 2024-06-03 ENCOUNTER — Encounter: Admitting: Obstetrics and Gynecology

## 2024-06-03 ENCOUNTER — Encounter: Payer: Self-pay | Admitting: Certified Nurse Midwife

## 2024-06-03 ENCOUNTER — Ambulatory Visit: Admitting: Certified Nurse Midwife

## 2024-06-03 VITALS — BP 144/78 | HR 75 | Ht 65.0 in | Wt 165.0 lb

## 2024-06-03 DIAGNOSIS — Z1331 Encounter for screening for depression: Secondary | ICD-10-CM | POA: Diagnosis not present

## 2024-06-03 DIAGNOSIS — Z01419 Encounter for gynecological examination (general) (routine) without abnormal findings: Secondary | ICD-10-CM

## 2024-06-03 DIAGNOSIS — F419 Anxiety disorder, unspecified: Secondary | ICD-10-CM | POA: Diagnosis not present

## 2024-06-03 DIAGNOSIS — Z3009 Encounter for other general counseling and advice on contraception: Secondary | ICD-10-CM

## 2024-06-03 DIAGNOSIS — Z113 Encounter for screening for infections with a predominantly sexual mode of transmission: Secondary | ICD-10-CM

## 2024-06-03 MED ORDER — FLUOXETINE HCL 20 MG PO CAPS: 20.0000 mg | ORAL_CAPSULE | Freq: Every day | ORAL | 3 refills | Status: DC

## 2024-06-03 NOTE — Progress Notes (Unsigned)
 Here to establish care-[facility] receive [facility], changed insurance    No longer breasting feeding, would like to talk about  possibly going back on prozac  for her anxiety   Scored 6 on GAD-7

## 2024-06-04 DIAGNOSIS — F419 Anxiety disorder, unspecified: Secondary | ICD-10-CM | POA: Insufficient documentation

## 2024-06-04 NOTE — Progress Notes (Signed)
 GYNECOLOGY ANNUAL PREVENTATIVE CARE ENCOUNTER NOTE  History:    Kelli James is a 23 y.o. G27P1001 female here to establish care as a new patient.   Current complaints: Anxiety. Patient reports recently stopping breastfeeding and switching from a POP to a COP as she was prior to pregnancy. Patient states she is very anxious and it is starting to affect her ADLs. She also reports insomnia, irritability and a notable increase in muscle tension.  Denies abnormal vaginal bleeding, discharge, pelvic pain, problems with intercourse or other gynecologic concerns.  Gynecologic History Patient's last menstrual period was 05/21/2024 (exact date). Contraception: OCP (estrogen/progesterone) Sprintec Last Pap: Fall 2023 . Result was normal per patient report  Obstetric History OB History  Gravida Para Term Preterm AB Living  1 1 1  0 0 1  SAB IAB Ectopic Multiple Live Births  0 0 0 0 1    # Outcome Date GA Lbr Len/2nd Weight Sex Type Anes PTL Lv  1 Term 10/29/23 [redacted]w[redacted]d 02:58 / 01:23 9 lb 10.2 oz (4.37 kg) M Vag-Vacuum EPI  LIV     Birth Comments: WNL    Past Medical History:  Diagnosis Date   Allergic rhinitis    Asthma     Past Surgical History:  Procedure Laterality Date   WISDOM TOOTH EXTRACTION  2016    Current Outpatient Medications on File Prior to Visit  Medication Sig Dispense Refill   norgestimate -ethinyl estradiol  (SPRINTEC 28) 0.25-35 MG-MCG tablet Take 1 tablet by mouth daily. 28 tablet 11   No current facility-administered medications on file prior to visit.    No Known Allergies  Social History:  reports that she has never smoked. She has never used smokeless tobacco. She reports that she does not drink alcohol and does not use drugs.  Family History  Problem Relation Age of Onset   Bipolar disorder Mother    Depression Mother    Hypertension Father    Diverticulitis Father    Healthy Sister    Lupus Paternal Grandmother    Thyroid  disease Paternal  Grandmother    Arthritis Paternal Grandmother    Heart disease Paternal Grandfather        has pacemaker   Atrial fibrillation Paternal Grandfather    Hypercholesterolemia Paternal Grandfather    Hypertension Paternal Grandfather    Depression Paternal Grandfather    Breast cancer Neg Hx    Colon cancer Neg Hx     The following portions of the patient's history were reviewed and updated as appropriate: allergies, current medications, past family history, past medical history, past social history, past surgical history and problem list.  Review of Systems Pertinent items noted in HPI and remainder of comprehensive ROS otherwise negative.  Physical Exam:  BP (!) 144/78   Pulse 75   Ht 5' 5 (1.651 m)   Wt 165 lb (74.8 kg)   LMP 05/21/2024 (Exact Date)   BMI 27.46 kg/m  CONSTITUTIONAL: Well-developed, well-nourished female in no acute distress.  HENT:  Normocephalic, atraumatic, External right and left ear normal.  EYES: Conjunctivae and EOM are normal. Pupils are equal, round, and reactive to light. No scleral icterus.  NECK: Normal range of motion, supple, no masses observed. SKIN: Skin is warm and dry. No rash noted. Not diaphoretic. No erythema. No pallor. MUSCULOSKELETAL: Normal range of motion. No tenderness.  No cyanosis, clubbing, or edema. NEUROLOGIC: Alert and oriented to person, place, and time. Normal muscle tone coordination.  PSYCHIATRIC: Normal mood and affect. Normal  behavior. Normal judgment and thought content. CARDIOVASCULAR: Normal heart rate noted, regular rhythm RESPIRATORY: Clear to auscultation bilaterally. Effort and breath sounds normal, no problems with respiration noted. ABDOMEN: Soft, no distention noted.  No tenderness, rebound or guarding.  PELVIC: Deferred.  Assessment and Plan:    1. Well woman exam (Primary) - Patient overall doing well.  - Welcomed to the practice   3. Birth control counseling - Stable on sprintec   4. Anxiety - Reviewed  that anxiety can be related to recent cessation of breastfeeding and or the combination of that and changing over to a oral contraception pill.  - Patient was previously on Prozac  and did well. Plan to restart at lower dosing and if needed to titrate back up. Patient agreeable to plan of care.  - Also recommended to seek counseling.  - Ambulatory referral to Integrated Behavioral Health  Preventative health maintenance measures emphasized. Please refer to After Visit Summary for other counseling recommendations.     Return in about 2 months (around 08/03/2024) for Mood Check .  Rubens Cranston Erven) [name], MSN, CNM  [facility]  06/04/2024 1:51 PM

## 2024-08-19 ENCOUNTER — Ambulatory Visit: Admitting: Certified Nurse Midwife

## 2024-08-19 ENCOUNTER — Encounter: Payer: Self-pay | Admitting: Certified Nurse Midwife

## 2024-08-19 VITALS — BP 118/76 | HR 66 | Wt 166.8 lb

## 2024-08-19 DIAGNOSIS — F411 Generalized anxiety disorder: Secondary | ICD-10-CM

## 2024-08-19 DIAGNOSIS — Z30011 Encounter for initial prescription of contraceptive pills: Secondary | ICD-10-CM

## 2024-08-19 DIAGNOSIS — Z1331 Encounter for screening for depression: Secondary | ICD-10-CM | POA: Diagnosis not present

## 2024-08-19 MED ORDER — NORGESTIMATE-ETH ESTRADIOL 0.25-35 MG-MCG PO TABS: 1.0000 | ORAL_TABLET | Freq: Every day | ORAL | 3 refills | Status: AC

## 2024-08-19 MED ORDER — NORGESTIMATE-ETH ESTRADIOL 0.25-35 MG-MCG PO TABS: 1.0000 | ORAL_TABLET | Freq: Every day | ORAL | 11 refills | Status: DC

## 2024-08-19 NOTE — Progress Notes (Signed)
 Kelli James  [phone] here for postpartum depression screen. Pt is almost 1 year postpartum. At the last visit she was recently stopping breast feeding unable to cope well. At that time she was started on Prozac  and is currently up to 20mg  daily and is stable.  Pt reports doing well, that she has no suicidal/homicidal inclination.       08/19/2024    1:11 PM 06/03/2024    2:22 PM 03/25/2024    2:37 PM 10/17/2022    8:27 AM 07/17/2022   10:37 AM  Depression screen PHQ 2/9  Decreased Interest 0 0 0 0 0  Down, Depressed, Hopeless 0 1 0 0 0  PHQ - 2 Score 0 1 0 0 0  Altered sleeping 0 0 0 0   Tired, decreased energy 0 0 0 0   Change in appetite 0 0 0 0   Feeling bad or failure about yourself  0 1 0 0   Trouble concentrating 0 2 0 0   Moving slowly or fidgety/restless 0 0 0 0   Suicidal thoughts 0 0 0 0   PHQ-9 Score 0 4 0 0   Difficult doing work/chores   Not difficult at all Not difficult at all        08/19/2024    1:10 PM 06/03/2024    2:22 PM 10/17/2022    8:27 AM 07/17/2022   10:37 AM  GAD 7 : Generalized Anxiety Score  Nervous, Anxious, on Edge 1 3 0 0  Control/stop worrying 0 1 0 0  Worry too much - different things 0 1 0 0  Trouble relaxing 0 1 0 0  Restless 0 0 0 0  Easily annoyed or irritable 0 0 0 0  Afraid - awful might happen 0 0 0 0  Total GAD 7 Score 1 6 0 0  Anxiety Difficulty   Not difficult at all    Patient doing well. Continue with current medication regimen. Reviewed when to return to office and recommendation to return if she decides to stop medication.   Rx for 3 month supply of BCP sent to outpatient pharmacy.   Pt to follow up as needed or return for annual in 1 year.   Patirica Longshore Erven) [name], MSN, CNM  [facility]  08/19/2024 1:35 PM

## 2024-10-01 ENCOUNTER — Other Ambulatory Visit: Payer: Self-pay | Admitting: Certified Nurse Midwife

## 2024-10-15 ENCOUNTER — Encounter: Payer: Self-pay | Admitting: Certified Nurse Midwife

## 2024-12-28 ENCOUNTER — Encounter: Payer: Self-pay | Admitting: Certified Nurse Midwife

## 2024-12-28 DIAGNOSIS — F320 Major depressive disorder, single episode, mild: Secondary | ICD-10-CM

## 2024-12-29 MED ORDER — FLUOXETINE HCL 10 MG PO CAPS: 10.0000 mg | ORAL_CAPSULE | Freq: Every day | ORAL | 11 refills | Status: AC

## 2025-03-29 ENCOUNTER — Encounter: Admitting: Family Medicine
